# Patient Record
Sex: Male | Born: 1960
Health system: Southern US, Community
[De-identification: ages and names within clinical notes are randomized; demographics above are authoritative.]

---

## 2005-12-09 ENCOUNTER — Ambulatory Visit: Payer: Self-pay

## 2017-11-17 ENCOUNTER — Ambulatory Visit: Payer: Managed Care, Other (non HMO) | Admitting: Urology

## 2017-11-17 ENCOUNTER — Encounter: Payer: Self-pay | Admitting: Urology

## 2017-11-17 VITALS — BP 128/74 | HR 94 | Ht 66.0 in | Wt 179.6 lb

## 2017-11-17 DIAGNOSIS — R972 Elevated prostate specific antigen [PSA]: Secondary | ICD-10-CM

## 2017-11-17 DIAGNOSIS — R310 Gross hematuria: Secondary | ICD-10-CM | POA: Diagnosis not present

## 2017-11-17 LAB — URINALYSIS, COMPLETE
Bilirubin, UA: NEGATIVE
Glucose, UA: NEGATIVE
Leukocytes, UA: NEGATIVE
Nitrite, UA: NEGATIVE
RBC, UA: NEGATIVE
Specific Gravity, UA: 1.025 (ref 1.005–1.030)
Urobilinogen, Ur: 1 mg/dL (ref 0.2–1.0)
pH, UA: 5.5 (ref 5.0–7.5)

## 2017-11-17 LAB — MICROSCOPIC EXAMINATION
Bacteria, UA: NONE SEEN
Epithelial Cells (non renal): NONE SEEN /hpf (ref 0–10)
RBC, UA: NONE SEEN /hpf (ref 0–?)
WBC, UA: NONE SEEN /hpf (ref 0–?)

## 2017-11-17 NOTE — Progress Notes (Signed)
11/17/2017 2:06 PM   Larry Moses 1961/04/27 [id number]  Referring provider: Kellie Shropshire, NP [address] [place], [place] [address]  Chief Complaint  Patient presents with  . Hematuria    Pt states that this happened for the first time last Wednesday     HPI: Larry Moses is a 57 year old male who on 11/08/2017 had onset of total gross painless hematuria.  He describes his urine as dark red without clots.  He had no voiding symptoms and denied flank, abdominal, pelvic or scrotal pain.  He states the next morning his urine was burgundy in appearance and by Friday his hematuria had resolved.  Denies previous history of hematuria or other urologic problems or prior urologic evaluation.  He did see his primary provider 3 years ago and his PSA was 4.1.  It was recommended that it be repeated however he never had this done. He does have a significant tobacco history and has been smoking 1 pack/day for the last 41 years. He was seen at an urgent care facility on 2/7 and urinalysis did show large blood on dipstick.   PMH: History reviewed. No pertinent past medical history.  Surgical History: History reviewed. No pertinent surgical history.  Home Medications:  Allergies as of 11/17/2017   No Known Allergies     Medication List    as of 11/17/2017  2:06 PM   You have not been prescribed any medications.     Allergies: No Known Allergies  Family History: Family History  Problem Relation Age of Onset  . Ovarian cancer Mother   . Breast cancer Mother   . Lung cancer Maternal Grandfather     Social History:  reports that he has been smoking cigarettes.  He has a 25.00 pack-year smoking history. His smokeless tobacco use includes snuff. He reports that he does not drink alcohol or use drugs.  ROS: UROLOGY Frequent Urination?: No Hard to postpone urination?: Yes Burning/pain with urination?: No Get up at night to urinate?: Yes Leakage of urine?: No Urine  stream starts and stops?: No Trouble starting stream?: No Do you have to strain to urinate?: No Blood in urine?: Yes Urinary tract infection?: No Sexually transmitted disease?: No Injury to kidneys or bladder?: No Painful intercourse?: No Weak stream?: No Erection problems?: No Penile pain?: No  Gastrointestinal Nausea?: No Vomiting?: No Indigestion/heartburn?: No Diarrhea?: No Constipation?: No  Constitutional Fever: No Night sweats?: No Weight loss?: No Fatigue?: No  Skin Skin rash/lesions?: No Itching?: No  Eyes Blurred vision?: No Double vision?: No  Ears/Nose/Throat Sore throat?: No Sinus problems?: No  Hematologic/Lymphatic Swollen glands?: No Easy bruising?: No  Cardiovascular Leg swelling?: No Chest pain?: No  Respiratory Cough?: No Shortness of breath?: No  Endocrine Excessive thirst?: No  Musculoskeletal Back pain?: No Joint pain?: No  Neurological Headaches?: No Dizziness?: No  Psychologic Depression?: No Anxiety?: No  Physical Exam: BP 128/74 (BP Location: Left Arm, Patient Position: Sitting, Cuff Size: Normal)   Pulse 94   Ht 5\' 6"  (1.676 m)   Wt 179 lb 9.6 oz (81.5 kg)   SpO2 97%   BMI 28.99 kg/m   Constitutional:  Alert and oriented, No acute distress. HEENT: [place] AT, moist mucus membranes.  Trachea midline, no masses. Cardiovascular: No clubbing, cyanosis, or edema. Respiratory: Normal respiratory effort, no increased work of breathing. GI: Abdomen is soft, nontender, nondistended, no abdominal masses GU: No CVA tenderness.  Penis without lesions, testes descended bilaterally without masses or tenderness cord/epididymes  palpably normal.  Prostate 40 g, smooth without nodules. Skin: No rashes, bruises or suspicious lesions. Lymph: No cervical or inguinal adenopathy. Neurologic: Grossly intact, no focal deficits, moving all 4 extremities. Psychiatric: Normal mood and affect.  Laboratory Data:  Urinalysis Dipstick 1+  ketone 1+ protein negative blood or leukocytes.  Microscopy negative   Assessment & Plan:    1. Gross hematuria We discussed potential causes of gross hematuria including both benign and malignant.  I recommended a complete hematuria evaluation to include CT urogram and cystoscopy.  - Urinalysis, Complete - CT HEMATURIA WORKUP; Future  2. Elevated PSA Benign DRE.  PSA was drawn today.  - PSA   No Follow-up on file.  Abbie Sons, [place] [address], [place], [place] [address] [phone]

## 2017-11-18 LAB — PSA: Prostate Specific Ag, Serum: 2.1 ng/mL (ref 0.0–4.0)

## 2017-11-21 ENCOUNTER — Telehealth: Payer: Self-pay | Admitting: Family Medicine

## 2017-11-21 ENCOUNTER — Encounter: Payer: Self-pay | Admitting: Family Medicine

## 2017-11-21 NOTE — Telephone Encounter (Signed)
-----   Message from Abbie Sons, MD sent at 11/20/2017  4:44 PM EST ----- Repeat psa was nml at 2.1

## 2017-11-21 NOTE — Telephone Encounter (Signed)
Letter sent.

## 2017-12-29 ENCOUNTER — Ambulatory Visit
Admission: RE | Admit: 2017-12-29 | Discharge: 2017-12-29 | Disposition: A | Discharge status: Home / Self Care | Payer: Managed Care, Other (non HMO) | Source: Ambulatory Visit | Attending: Urology | Admitting: Urology

## 2017-12-29 DIAGNOSIS — N400 Enlarged prostate without lower urinary tract symptoms: Secondary | ICD-10-CM | POA: Diagnosis not present

## 2017-12-29 DIAGNOSIS — R310 Gross hematuria: Secondary | ICD-10-CM | POA: Insufficient documentation

## 2017-12-29 DIAGNOSIS — I700 Atherosclerosis of aorta: Secondary | ICD-10-CM | POA: Diagnosis not present

## 2017-12-29 DIAGNOSIS — N289 Disorder of kidney and ureter, unspecified: Secondary | ICD-10-CM | POA: Diagnosis not present

## 2017-12-29 DIAGNOSIS — R911 Solitary pulmonary nodule: Secondary | ICD-10-CM | POA: Diagnosis not present

## 2017-12-29 MED ORDER — IOPAMIDOL (ISOVUE-300) INJECTION 61%
125.0000 mL | Freq: Once | INTRAVENOUS | Status: AC | PRN
  Administered 2017-12-29: 125 mL via INTRAVENOUS

## 2018-01-02 ENCOUNTER — Other Ambulatory Visit: Payer: Managed Care, Other (non HMO) | Admitting: Urology

## 2018-01-26 ENCOUNTER — Encounter: Payer: Self-pay | Admitting: Urology

## 2018-01-26 ENCOUNTER — Ambulatory Visit: Payer: Managed Care, Other (non HMO) | Admitting: Urology

## 2018-01-26 VITALS — BP 143/85 | HR 84 | Ht 66.0 in | Wt 175.0 lb

## 2018-01-26 DIAGNOSIS — N2889 Other specified disorders of kidney and ureter: Secondary | ICD-10-CM

## 2018-01-26 DIAGNOSIS — R310 Gross hematuria: Secondary | ICD-10-CM | POA: Diagnosis not present

## 2018-01-26 LAB — MICROSCOPIC EXAMINATION: Bacteria, UA: NONE SEEN

## 2018-01-26 LAB — URINALYSIS, COMPLETE
Bilirubin, UA: NEGATIVE
Glucose, UA: NEGATIVE
Leukocytes, UA: NEGATIVE
Nitrite, UA: NEGATIVE
Protein, UA: NEGATIVE
RBC, UA: NEGATIVE
Specific Gravity, UA: 1.02 (ref 1.005–1.030)
Urobilinogen, Ur: 0.2 mg/dL (ref 0.2–1.0)
pH, UA: 5 (ref 5.0–7.5)

## 2018-01-26 MED ORDER — LIDOCAINE HCL URETHRAL/MUCOSAL 2 % EX GEL: 1.0000 "application " | Freq: Once | CUTANEOUS | Status: DC

## 2018-01-26 MED ORDER — CIPROFLOXACIN HCL 500 MG PO TABS: 500.0000 mg | ORAL_TABLET | Freq: Once | ORAL | Status: DC

## 2018-01-26 NOTE — Progress Notes (Signed)
01/26/2018 1:57 PM   Larry Moses [birth year] [id number]  Referring provider: No referring provider defined for this encounter.  Chief Complaint  Patient presents with  . Cysto    HPI: 57 year old male seen on 11/17/2017 after an episode of total gross painless hematuria.  He has a significant tobacco history.  He had his CT urogram on 3/29 and was scheduled for cystoscopy today however he refused to have cystoscopy done stating he has not had any recurrent bleeding and he just wanted his CT results.  His CT showed a 17 mm right upper pole mass that was felt to most likely be a cyst however there was slight enhancement noted most likely due to volume averaging.  Radiology did recommend a renal mass protocol MRI.   PMH: No past medical history on file.  Surgical History: No past surgical history on file.  Home Medications:  Allergies as of 01/26/2018   No Known Allergies     Medication List    as of 01/26/2018  1:57 PM   You have not been prescribed any medications.     Allergies: No Known Allergies  Family History: Family History  Problem Relation Age of Onset  . Ovarian cancer Mother   . Breast cancer Mother   . Lung cancer Maternal Grandfather     Social History:  reports that he has been smoking cigarettes.  He has a 25.00 pack-year smoking history. His smokeless tobacco use includes snuff. He reports that he does not drink alcohol or use drugs.  ROS: No significant changes from 11/17/2017  Physical Exam: BP (!) 143/85 (BP Location: Left Arm, Patient Position: Sitting, Cuff Size: Normal)   Pulse 84   Ht 5\' 6"  (1.676 m)   Wt 175 lb (79.4 kg)   BMI 28.25 kg/m   Constitutional:  Alert and oriented, No acute distress.   Pertinent Imaging: CT images personally reviewed  Results for orders placed during the hospital encounter of 12/29/17  CT HEMATURIA WORKUP   Narrative CLINICAL DATA:  Painless gross hematuria 6 weeks ago.  EXAM: CT ABDOMEN AND  PELVIS WITHOUT AND WITH CONTRAST  TECHNIQUE: Multidetector CT imaging of the abdomen and pelvis was performed following the standard protocol before and following the bolus administration of intravenous contrast.  CONTRAST:  140mL ISOVUE-300 IOPAMIDOL (ISOVUE-300) INJECTION 61%  COMPARISON:  None.  FINDINGS: Lower chest: 2 mm left lower lobe pulmonary nodule on image 1/24. Normal heart size without pericardial or pleural effusion.  Hepatobiliary: Mild nonspecific caudate lobe prominence. No focal liver lesion. Normal gallbladder, without biliary ductal dilatation.  Pancreas: Normal, without mass or ductal dilatation.  Spleen: Normal in size, without focal abnormality.  Adrenals/Urinary Tract: Normal adrenal glands. No renal calculi or hydronephrosis. No hydroureter or ureteric calculi. No bladder calculi.  Exophytic upper pole right renal lesion measures 1.7 cm on image 30/9. Density measurements best evaluated on coronal reformats. Approximately 18 HU prior to contrast and maximally 31 HU after contrast.  bilateral too small to characterize lesions. Moderate renal collecting system opacification on delayed images. Good ureteric opacification, without filling defect. The median lobe of the prostate impresses into the urinary bladder. No enhancing bladder mass or other bladder filling defect on delayed images.  Stomach/Bowel: Normal stomach, without wall thickening. Scattered colonic diverticula. Wall thickening involving the sigmoid is likely related to muscular hypertrophy. Normal terminal ileum and appendix. Normal small bowel.  Vascular/Lymphatic: Aortic and branch vessel atherosclerosis. No abdominopelvic adenopathy. Increased density in the jejunal mesenteric fat with  small nodes within. Example image 32/9.  Reproductive: Mild prostatomegaly.  Other: No significant free fluid. Tiny periumbilical fat containing hernia.  Musculoskeletal: Mild convex left lumbar  spine curvature. Lumbosacral spondylosis.  IMPRESSION: 1. Right renal lesion is favored to represent a minimally complex cyst. Apparent density increase after contrast is most likely related to "pseudo enhancement" a CT reconstruction artifact. True enhancement of a solid renal neoplasm is less likely. Consider further evaluation with dedicated pre and post contrast abdominal MRI. 2. Otherwise, no explanation for hematuria. 3.  Aortic Atherosclerosis (ICD10-I70.0). 4. Prostatomegaly with impression of the median lobe into the urinary bladder. 5. Jejunal mesenteric findings which may represent mild mesenteric panniculitis. This is of indeterminate clinical significance, as appearance can be seen in asymptomatic individuals. 6. Tiny left lung base nodule. No follow-up needed if patient is low-risk. Non-contrast chest CT can be considered in 12 months if patient is high-risk. This recommendation follows the consensus statement: Guidelines for Management of Incidental Pulmonary Nodules Detected on CT Images: From the Fleischner Society 2017; Radiology 2017; 284:228-243.   Electronically Signed   By: Abigail Miyamoto M.D.   On: 12/29/2017 14:34     Assessment & Plan:   His right renal lesion is most likely a cyst.  Will schedule a renal mass protocol MRI with follow-up in approximately 3 months.  I discussed the standard evaluation for hematuria his upper tract imaging for the kidney/ureters and cystoscopy for lower tract evaluation.  He was informed that without cystoscopy the evaluation is considered incomplete and that a bladder or lower tract malignancy may be missed.  He was also informed that bladder cancers can bleed intermittently and the fact that he has not had recurrent hematuria and does not rule out the possibility of malignancy.  He indicated if he had recurrent hematuria he would reconsider the cystoscopy.    Abbie Sons, [place] [address], [place], [place] [address] [phone]

## 2018-04-26 ENCOUNTER — Ambulatory Visit
Admission: RE | Admit: 2018-04-26 | Discharge: 2018-04-26 | Disposition: A | Discharge status: Home / Self Care | Payer: Managed Care, Other (non HMO) | Source: Ambulatory Visit | Attending: Urology | Admitting: Urology

## 2018-04-26 DIAGNOSIS — N2889 Other specified disorders of kidney and ureter: Secondary | ICD-10-CM | POA: Insufficient documentation

## 2018-04-26 DIAGNOSIS — N281 Cyst of kidney, acquired: Secondary | ICD-10-CM | POA: Diagnosis not present

## 2018-04-26 MED ORDER — GADOBENATE DIMEGLUMINE 529 MG/ML IV SOLN
15.0000 mL | Freq: Once | INTRAVENOUS | Status: AC | PRN
  Administered 2018-04-26: 15 mL via INTRAVENOUS

## 2018-04-27 ENCOUNTER — Ambulatory Visit: Payer: Managed Care, Other (non HMO) | Admitting: Urology

## 2018-04-27 ENCOUNTER — Telehealth: Payer: Self-pay | Admitting: Urology

## 2018-04-27 NOTE — Telephone Encounter (Signed)
Pt had to reschedule appt this afternoon and first available was 9/11.  He wanted to know if you could call him with MRI results.

## 2018-04-29 NOTE — Telephone Encounter (Signed)
Renal MRI shows a benign hemorrhagic/proteinaceous cyst.  Follow-up as scheduled.

## 2018-05-01 NOTE — Telephone Encounter (Signed)
Pt informed

## 2018-06-12 ENCOUNTER — Telehealth: Payer: Self-pay | Admitting: Urology

## 2018-06-12 NOTE — Telephone Encounter (Signed)
Pt called to cancel appt on Wed. 9/11.  He said he's already been given results over the phone and he would call back if he needed [place].  Just F.Y.I.

## 2018-06-13 ENCOUNTER — Ambulatory Visit: Payer: Managed Care, Other (non HMO) | Admitting: Urology

## 2018-12-20 ENCOUNTER — Ambulatory Visit: Payer: Managed Care, Other (non HMO) | Admitting: Urology

## 2018-12-20 ENCOUNTER — Encounter: Payer: Self-pay | Admitting: Urology

## 2018-12-20 ENCOUNTER — Other Ambulatory Visit: Payer: Self-pay

## 2018-12-20 VITALS — BP 154/82 | HR 81 | Ht 66.0 in | Wt 179.0 lb

## 2018-12-20 DIAGNOSIS — R310 Gross hematuria: Secondary | ICD-10-CM

## 2018-12-20 NOTE — Progress Notes (Signed)
12/20/2018  2:34 PM   Larry Moses 25-Dec-1960 [id number]  Referring provider: No referring provider defined for this encounter.  Chief Complaint  Patient presents with  . Hematuria    HPI: Larry Moses is a 58 yo M who presents today for the evaluation and management of gross hematuria.   -Gross hematuria throughout yesterday and resolved prior to visit today   -No pain and discomfort with gross hematuria   - Refused to have cystoscopy done in the past since he did not have any recurrent bleeding at the time and only elected for CT results  -CT from 12/29/2017 showed a 17 mm right upper pole mass that was felt to most likely be a cyst however slight enhancement noted most likely due to volume averaging.  -Renal MRI form 04/26/2018  recommended by radiology showed a benign hemorrhagic/proteinaceous cyst.  -Gross hematuria throughout yesterday and resolved prior to visit today   -No pain and discomfort with gross hematuria   PMH: No past medical history on file.  Surgical History: No past surgical history on file.  Home Medications:  Allergies as of 12/20/2018   No Known Allergies     Medication List    as of December 20, 2018  2:34 PM   You have not been prescribed any medications.     Allergies: No Known Allergies  Family History: Family History  Problem Relation Age of Onset  . Ovarian cancer Mother   . Breast cancer Mother   . Lung cancer Maternal Grandfather     Social History:  reports that he has been smoking cigarettes. He has a 25.00 pack-year smoking history. His smokeless tobacco use includes snuff. He reports that he does not drink alcohol or use drugs.  ROS: UROLOGY Frequent Urination?: No Hard to postpone urination?: No Burning/pain with urination?: No Get up at night to urinate?: No Leakage of urine?: No Urine stream starts and stops?: No Trouble starting stream?: No Do you have to strain to urinate?: No Blood in urine?: Yes Urinary  tract infection?: No Sexually transmitted disease?: No Injury to kidneys or bladder?: No Painful intercourse?: No Weak stream?: No Erection problems?: No Penile pain?: No  Gastrointestinal Nausea?: No Vomiting?: No Indigestion/heartburn?: No Diarrhea?: No Constipation?: No  Constitutional Fever: No Night sweats?: No Weight loss?: No Fatigue?: No  Skin Skin rash/lesions?: No Itching?: No  Eyes Blurred vision?: No Double vision?: No  Ears/Nose/Throat Sore throat?: No Sinus problems?: No  Hematologic/Lymphatic Swollen glands?: No Easy bruising?: No  Cardiovascular Leg swelling?: No Chest pain?: No  Respiratory Cough?: No Shortness of breath?: No  Endocrine Excessive thirst?: No  Musculoskeletal Back pain?: No Joint pain?: No  Neurological Headaches?: No Dizziness?: No  Psychologic Depression?: No Anxiety?: No  Physical Exam: BP (!) 154/82 (BP Location: Left Arm, Patient Position: Sitting, Cuff Size: Normal)   Pulse 81   Ht 5\' 6"  (1.676 m)   Wt 179 lb (81.2 kg)   BMI 28.89 kg/m   Constitutional:  Alert and oriented, No acute distress. HEENT: [place] AT, moist mucus membranes.  Trachea midline, no masses. Cardiovascular: No clubbing, cyanosis, or edema. Respiratory: Normal respiratory effort, no increased work of breathing. Skin: No rashes, bruises or suspicious lesions. Neurologic: Grossly intact, no focal deficits, moving all 4 extremities. Psychiatric: Normal mood and affect.  Pertinent Imaging: Results for orders placed during the hospital encounter of 12/29/17  CT HEMATURIA WORKUP   Narrative CLINICAL DATA:  Painless gross hematuria 6 weeks ago.  EXAM:  CT ABDOMEN AND PELVIS WITHOUT AND WITH CONTRAST  TECHNIQUE: Multidetector CT imaging of the abdomen and pelvis was performed following the standard protocol before and following the bolus administration of intravenous contrast.  CONTRAST:  180mL ISOVUE-300 IOPAMIDOL (ISOVUE-300)  INJECTION 61%  COMPARISON:  None.  FINDINGS: Lower chest: 2 mm left lower lobe pulmonary nodule on image 1/24. Normal heart size without pericardial or pleural effusion.  Hepatobiliary: Mild nonspecific caudate lobe prominence. No focal liver lesion. Normal gallbladder, without biliary ductal dilatation.  Pancreas: Normal, without mass or ductal dilatation.  Spleen: Normal in size, without focal abnormality.  Adrenals/Urinary Tract: Normal adrenal glands. No renal calculi or hydronephrosis. No hydroureter or ureteric calculi. No bladder calculi.  Exophytic upper pole right renal lesion measures 1.7 cm on image 30/9. Density measurements best evaluated on coronal reformats. Approximately 18 HU prior to contrast and maximally 31 HU after contrast.  bilateral too small to characterize lesions. Moderate renal collecting system opacification on delayed images. Good ureteric opacification, without filling defect. The median lobe of the prostate impresses into the urinary bladder. No enhancing bladder mass or other bladder filling defect on delayed images.  Stomach/Bowel: Normal stomach, without wall thickening. Scattered colonic diverticula. Wall thickening involving the sigmoid is likely related to muscular hypertrophy. Normal terminal ileum and appendix. Normal small bowel.  Vascular/Lymphatic: Aortic and branch vessel atherosclerosis. No abdominopelvic adenopathy. Increased density in the jejunal mesenteric fat with small nodes within. Example image 32/9.  Reproductive: Mild prostatomegaly.  Other: No significant free fluid. Tiny periumbilical fat containing hernia.  Musculoskeletal: Mild convex left lumbar spine curvature. Lumbosacral spondylosis.  IMPRESSION: 1. Right renal lesion is favored to represent a minimally complex cyst. Apparent density increase after contrast is most likely related to "pseudo enhancement" a CT reconstruction artifact. True enhancement of a  solid renal neoplasm is less likely. Consider further evaluation with dedicated pre and post contrast abdominal MRI. 2. Otherwise, no explanation for hematuria. 3.  Aortic Atherosclerosis (ICD10-I70.0). 4. Prostatomegaly with impression of the median lobe into the urinary bladder. 5. Jejunal mesenteric findings which may represent mild mesenteric panniculitis. This is of indeterminate clinical significance, as appearance can be seen in asymptomatic individuals. 6. Tiny left lung base nodule. No follow-up needed if patient is low-risk. Non-contrast chest CT can be considered in 12 months if patient is high-risk. This recommendation follows the consensus statement: Guidelines for Management of Incidental Pulmonary Nodules Detected on CT Images: From the Fleischner Society 2017; Radiology 2017; 284:228-243.   Electronically Signed   By: Abigail Miyamoto M.D.   On: 12/29/2017 14:34    CLINICAL DATA:  Hematuria. Indeterminate right renal lesion on recent CT.  EXAM: MRI ABDOMEN WITHOUT AND WITH CONTRAST  TECHNIQUE: Multiplanar multisequence MR imaging of the abdomen was performed both before and after the administration of intravenous contrast.  CONTRAST:  46mL MULTIHANCE GADOBENATE DIMEGLUMINE 529 MG/ML IV SOLN  COMPARISON:  CT on 12/29/2017  FINDINGS: Lower chest: No acute findings.  Hepatobiliary: No hepatic masses identified. Gallbladder is unremarkable. No evidence of biliary ductal dilatation.  Pancreas:  No mass or inflammatory changes.  Spleen:  Within normal limits in size and appearance.  Adrenals/Urinary Tract: A 1.8 cm subcapsular cyst is seen in the posterior upper pole right kidney which shows minimal layering T1 hyperintensity, consistent with a Bosniak category 2 hemorrhagic cyst. No renal masses identified. No evidence of hydronephrosis.  Stomach/Bowel: Visualized portion unremarkable.  Vascular/Lymphatic: No pathologically enlarged lymph nodes  identified. No abdominal aortic aneurysm.  Other:  None.  Musculoskeletal:  No suspicious bone lesions identified.  IMPRESSION: 1.8 cm benign Bosniak category 2 right renal cyst.  No evidence of renal neoplasm, hydronephrosis, or other significant abnormality.   Electronically Signed   By: Earle Gell M.D.   On: 04/26/2018 13:02  Assessment & Plan:    1. Gross hematuria   Refused cystoscopy in past  CT and MRI indicated benign cyst  Pt willing to have cystoscopy done and will return for next available cysto   Return for cysto .  Abbie Sons, [place] [address], [place], [place] [address] [phone]  I, Lucas Mallow, am acting as a scribe for Dr. Nicki Reaper C. Stoioff,  I, Abbie Sons, MD, have reviewed all documentation for this visit. The documentation on 12/20/18 for the exam, diagnosis, procedures, and orders are all accurate and complete.

## 2018-12-20 NOTE — Patient Instructions (Signed)
Hematuria, Adult  Hematuria is blood in the urine. Blood may be visible in the urine, or it may be identified with a test. This condition can be caused by infections of the bladder, urethra, kidney, or prostate. Other possible causes include:   Kidney stones.   Cancer of the urinary tract.   Too much calcium in the urine.   Conditions that are passed from parent to child (inherited conditions).   Exercise that requires a lot of energy.  Infections can usually be treated with medicine, and a kidney stone usually will pass through your urine. If neither of these is the cause of your hematuria, more tests may be needed to identify the cause of your symptoms.  It is very important to tell your health care provider about any blood in your urine, even if it is painless or the blood stops without treatment. Blood in the urine, when it happens and then stops and then happens again, can be a symptom of a very serious condition, including cancer. There is no pain in the initial stages of many urinary cancers.  Follow these instructions at home:  Medicines   Take over-the-counter and prescription medicines only as told by your health care provider.   If you were prescribed an antibiotic medicine, take it as told by your health care provider. Do not stop taking the antibiotic even if you start to feel better.  Eating and drinking   Drink enough fluid to keep your urine clear or pale yellow. It is recommended that you drink 3-4 quarts (2.8-3.8 L) a day. If you have been diagnosed with an infection, it is recommended that you drink cranberry juice in addition to large amounts of water.   Avoid caffeine, tea, and carbonated beverages. These tend to irritate the bladder.   Avoid alcohol because it may irritate the prostate (men).  General instructions   If you have been diagnosed with a kidney stone, follow your health care provider's instructions about straining your urine to catch the stone.   Empty your bladder  often. Avoid holding urine for long periods of time.   If you are male:  ? After a bowel movement, wipe from front to back and use each piece of toilet paper only once.  ? Empty your bladder before and after sex.   Pay attention to any changes in your symptoms. Tell your health care provider about any changes or any new symptoms.   It is your responsibility to get your test results. Ask your health care provider, or the department performing the test, when your results will be ready.   Keep all follow-up visits as told by your health care provider. This is important.  Contact a health care provider if:   You develop back pain.   You have a fever.   You have nausea or vomiting.   Your symptoms do not improve after 3 days.   Your symptoms get worse.  Get help right away if:   You develop severe vomiting and are unable take medicine without vomiting.   You develop severe pain in your back or abdomen even though you are taking medicine.   You pass a large amount of blood in your urine.   You pass blood clots in your urine.   You feel very weak or like you might faint.   You faint.  Summary   Hematuria is blood in the urine. It has many possible causes.   It is very important that you tell   your health care provider about any blood in your urine, even if it is painless or the blood stops without treatment.   Take over-the-counter and prescription medicines only as told by your health care provider.   Drink enough fluid to keep your urine clear or pale yellow.  This information is not intended to replace advice given to you by your health care provider. Make sure you discuss any questions you have with your health care provider.  Document Released: 09/19/2005 Document Revised: 10/22/2016 Document Reviewed: 10/22/2016  Elsevier Interactive Patient Education  2019 [employer]

## 2019-01-04 ENCOUNTER — Other Ambulatory Visit: Payer: Self-pay | Admitting: Urology

## 2019-01-08 ENCOUNTER — Other Ambulatory Visit: Payer: Self-pay

## 2019-01-08 ENCOUNTER — Ambulatory Visit: Payer: Managed Care, Other (non HMO) | Admitting: Urology

## 2019-01-08 ENCOUNTER — Encounter: Payer: Self-pay | Admitting: Urology

## 2019-01-08 VITALS — BP 138/87 | HR 82 | Ht 66.0 in | Wt 179.0 lb

## 2019-01-08 DIAGNOSIS — R310 Gross hematuria: Secondary | ICD-10-CM

## 2019-01-08 LAB — URINALYSIS, COMPLETE
Bilirubin, UA: NEGATIVE
Glucose, UA: NEGATIVE
Ketones, UA: NEGATIVE
Leukocytes,UA: NEGATIVE
Nitrite, UA: NEGATIVE
Protein,UA: NEGATIVE
RBC, UA: NEGATIVE
Specific Gravity, UA: 1.025 (ref 1.005–1.030)
Urobilinogen, Ur: 0.2 mg/dL (ref 0.2–1.0)
pH, UA: 5 (ref 5.0–7.5)

## 2019-01-08 LAB — MICROSCOPIC EXAMINATION
Bacteria, UA: NONE SEEN
Epithelial Cells (non renal): NONE SEEN /hpf (ref 0–10)
RBC: NONE SEEN /hpf (ref 0–2)
WBC, UA: NONE SEEN /hpf (ref 0–5)

## 2019-01-08 MED ORDER — LIDOCAINE HCL URETHRAL/MUCOSAL 2 % EX GEL: 1.0000 "application " | Freq: Once | CUTANEOUS | Status: DC

## 2019-01-08 NOTE — Progress Notes (Signed)
   01/08/19  CC:  Chief Complaint  Patient presents with  . Cysto    HPI: 58 year old male with recurrent gross hematuria.  Initially evaluated March 2019.  CTU showed a 17 mm exophytic upper pole right renal lesion with questionable enhancement.  MRI July 2019 consistent with a Bosniak 2 renal cyst.  He declined cystoscopy last year however had a recent episode of recurrent gross hematuria and elected to proceed with lower tract evaluation.  His hematuria has not recurred.  Blood pressure 138/87, pulse 82, height 5\' 6"  (1.676 m), weight 179 lb (81.2 kg). NED. A&Ox3.   No respiratory distress   Abd soft, NT, ND Normal phallus with bilateral descended testicles  Cystoscopy Procedure Note  Patient identification was confirmed, informed consent was obtained, and patient was prepped using Betadine solution.  Lidocaine jelly was administered per urethral meatus.     Pre-Procedure: - Inspection reveals a normal caliber urethral meatus.  Procedure: The flexible cystoscope was introduced without difficulty - No urethral strictures/lesions are present. - Prominent lateral lobe enlargement with 4 cm prostatic urethra.  Prominent hypervascularity and friability prostate  - Elevated bladder neck - Bilateral ureteral orifices identified - Bladder mucosa  reveals no ulcers, tumors, or lesions - No bladder stones -Mild trabeculation  Retroflexion shows prominent hypervascularity of the bladder neck, no tumor   Post-Procedure: - Patient tolerated the procedure well  Assessment/ Plan: Gross hematuria most likely secondary to BPH.  Urine was sent for cytology and if negative recommend a one-year follow-up.  To call for recurrent hematuria.  Return in about 1 year (around 01/08/2020) for Recheck.   Abbie Sons, MD

## 2019-01-10 ENCOUNTER — Other Ambulatory Visit: Payer: Self-pay | Admitting: Urology

## 2019-01-10 NOTE — Progress Notes (Signed)
Urine cytology showed no cells suspicious for malignancy.    Informed patient as directed-per Dr. Dene Gentry note. Patient verbalized understanding.

## 2020-01-10 ENCOUNTER — Ambulatory Visit: Payer: Managed Care, Other (non HMO) | Admitting: Urology

## 2020-01-19 NOTE — Progress Notes (Incomplete)
   01/20/20 1:34 PM   Larry Moses [birth year] [id number]  Referring provider: Cletis Athens, MD [address] [place],  [place] [address]  No chief complaint on file.   HPI: Larry Moses is a 59 y.o. M who returns today for 1 year f/u for symptom recheck.   -hx of recurrent gross hematuria  -CTU  12/29/2017, 17 mm exophytic upper pole right renal lesion with questionable enlargement  -MRI July 2019 consistent with Bosniak 2 renal cyst    1. ***  *** 2. *** *** 3. *** ***       PMH: No past medical history on file.  Surgical History: No past surgical history on file.  Home Medications:  Allergies as of 01/20/2020   No Known Allergies     Medication List    as of January 19, 2020  1:34 PM   You have not been prescribed any medications.     Allergies: No Known Allergies  Family History: Family History  Problem Relation Age of Onset  . Ovarian cancer Mother   . Breast cancer Mother   . Lung cancer Maternal Grandfather     Social History:  reports that he has been smoking cigarettes. He has a 25.00 pack-year smoking history. His smokeless tobacco use includes snuff. He reports that he does not drink alcohol or use drugs.   Physical Exam: There were no vitals taken for this visit.  Constitutional:  Alert and oriented, No acute distress. HEENT: [name] AT, moist mucus membranes.  Trachea midline, no masses. Cardiovascular: No clubbing, cyanosis, or edema. Respiratory: Normal respiratory effort, no increased work of breathing. GI: Abdomen is soft, nontender, nondistended, no abdominal masses GU: No CVA tenderness Lymph: No cervical or inguinal lymphadenopathy. Skin: No rashes, bruises or suspicious lesions. Neurologic: Grossly intact, no focal deficits, moving all 4 extremities. Psychiatric: Normal mood and affect.  Laboratory Data:  Urinalysis  Pertinent Imaging: ***  Assessment & Plan:    @DIAGMED @  No follow-ups on file.  [facility] [address], [place], [place] [address] [phone]  I, Lucas Mallow, am acting as a scribe for Dr. Nicki Reaper C. Stoioff,  {Add Media planner

## 2020-01-20 ENCOUNTER — Ambulatory Visit: Payer: Self-pay | Admitting: Urology

## 2020-01-20 ENCOUNTER — Encounter: Payer: Self-pay | Admitting: Urology

## 2020-07-01 ENCOUNTER — Other Ambulatory Visit: Payer: Self-pay

## 2020-07-01 DIAGNOSIS — Z20822 Contact with and (suspected) exposure to covid-19: Secondary | ICD-10-CM

## 2020-07-01 DIAGNOSIS — M7021 Olecranon bursitis, right elbow: Secondary | ICD-10-CM | POA: Diagnosis not present

## 2020-07-02 LAB — SARS-COV-2, NAA 2 DAY TAT

## 2020-07-02 LAB — NOVEL CORONAVIRUS, NAA: SARS-CoV-2, NAA: NOT DETECTED

## 2020-07-28 ENCOUNTER — Ambulatory Visit (INDEPENDENT_AMBULATORY_CARE_PROVIDER_SITE_OTHER): Payer: BC Managed Care – PPO

## 2020-07-28 ENCOUNTER — Other Ambulatory Visit: Payer: Self-pay

## 2020-07-28 DIAGNOSIS — Z0000 Encounter for general adult medical examination without abnormal findings: Secondary | ICD-10-CM | POA: Diagnosis not present

## 2020-07-29 LAB — CBC WITH DIFFERENTIAL/PLATELET
Absolute Monocytes: 599 cells/uL (ref 200–950)
Basophils Absolute: 19 cells/uL (ref 0–200)
Basophils Relative: 0.3 %
Eosinophils Absolute: 50 cells/uL (ref 15–500)
Eosinophils Relative: 0.8 %
HCT: 47.9 % (ref 38.5–50.0)
Hemoglobin: 16.7 g/dL (ref 13.2–17.1)
Lymphs Abs: 2205 cells/uL (ref 850–3900)
MCH: 32.2 pg (ref 27.0–33.0)
MCHC: 34.9 g/dL (ref 32.0–36.0)
MCV: 92.5 fL (ref 80.0–100.0)
MPV: 10.3 fL (ref 7.5–12.5)
Monocytes Relative: 9.5 %
Neutro Abs: 3427 cells/uL (ref 1500–7800)
Neutrophils Relative %: 54.4 %
Platelets: 210 10*3/uL (ref 140–400)
RBC: 5.18 10*6/uL (ref 4.20–5.80)
RDW: 12.3 % (ref 11.0–15.0)
Total Lymphocyte: 35 %
WBC: 6.3 10*3/uL (ref 3.8–10.8)

## 2020-07-29 LAB — COMPLETE METABOLIC PANEL WITH GFR
AG Ratio: 1.7 (calc) (ref 1.0–2.5)
ALT: 26 U/L (ref 9–46)
AST: 25 U/L (ref 10–35)
Albumin: 4.3 g/dL (ref 3.6–5.1)
Alkaline phosphatase (APISO): 71 U/L (ref 35–144)
BUN: 11 mg/dL (ref 7–25)
CO2: 22 mmol/L (ref 20–32)
Calcium: 8.9 mg/dL (ref 8.6–10.3)
Chloride: 104 mmol/L (ref 98–110)
Creat: 0.71 mg/dL (ref 0.70–1.25)
GFR, Est African American: 118 mL/min/{1.73_m2} (ref >=60)
GFR, Est Non African American: 102 mL/min/{1.73_m2} (ref >=60)
Globulin: 2.5 g/dL (calc) (ref 1.9–3.7)
Glucose, Bld: 81 mg/dL (ref 65–99)
Potassium: 4.4 mmol/L (ref 3.5–5.3)
Sodium: 142 mmol/L (ref 135–146)
Total Bilirubin: 0.8 mg/dL (ref 0.2–1.2)
Total Protein: 6.8 g/dL (ref 6.1–8.1)

## 2020-07-29 LAB — HEMOGLOBIN A1C
Hgb A1c MFr Bld: 5.3 % of total Hgb (ref <5.7)
Mean Plasma Glucose: 105 (calc)
eAG (mmol/L): 5.8 (calc)

## 2020-07-29 LAB — TSH: TSH: 3.24 mIU/L (ref 0.40–4.50)

## 2020-07-29 LAB — PSA: PSA: 2.53 ng/mL (ref <=4.0)

## 2020-07-30 ENCOUNTER — Ambulatory Visit (INDEPENDENT_AMBULATORY_CARE_PROVIDER_SITE_OTHER): Payer: BC Managed Care – PPO | Admitting: Family Medicine

## 2020-07-30 ENCOUNTER — Encounter: Payer: Self-pay | Admitting: Family Medicine

## 2020-07-30 ENCOUNTER — Other Ambulatory Visit: Payer: Self-pay

## 2020-07-30 VITALS — BP 144/79 | HR 78 | Ht 67.0 in | Wt 183.9 lb

## 2020-07-30 DIAGNOSIS — Z0000 Encounter for general adult medical examination without abnormal findings: Secondary | ICD-10-CM | POA: Diagnosis not present

## 2020-07-30 DIAGNOSIS — T148XXA Other injury of unspecified body region, initial encounter: Secondary | ICD-10-CM | POA: Diagnosis not present

## 2020-07-30 DIAGNOSIS — Z23 Encounter for immunization: Secondary | ICD-10-CM | POA: Diagnosis not present

## 2020-07-30 DIAGNOSIS — Z720 Tobacco use: Secondary | ICD-10-CM | POA: Insufficient documentation

## 2020-07-30 NOTE — Assessment & Plan Note (Addendum)
No colon screening ever performed. Does not recall last TDAP will give today Has not had Low Dose CT this year.  He will call for eye exam.  PSA increased slightly from previous, will continue to monitor.

## 2020-07-30 NOTE — Assessment & Plan Note (Signed)
Recent abrasion healed, no sign of infection. TDAP ordered.

## 2020-07-30 NOTE — Assessment & Plan Note (Signed)
-   The patient currently smokes regularly. - change or avoid triggers like smoky places, drinking alcohol and other smokers

## 2020-07-30 NOTE — Progress Notes (Signed)
Established Patient Office Visit  SUBJECTIVE:  Subjective  Patient ID: Larry Moses, male    DOB: 08/28/1961  Age: 59 y.o. MRN: [id number]  CC:  Chief Complaint  Patient presents with  . Annual Exam    HPI Larry Moses is a 59 y.o. male presenting today for annual exam, no acute complaints. Smokes daily.   History reviewed. No pertinent past medical history.  History reviewed. No pertinent surgical history.  Family History  Problem Relation Age of Onset  . Ovarian cancer Mother   . Breast cancer Mother   . Lung cancer Maternal Grandfather     Social History   Socioeconomic History  . Marital status: Married    Spouse name: Not on file  . Number of children: Not on file  . Years of education: Not on file  . Highest education level: Not on file  Occupational History  . Not on file  Tobacco Use  . Smoking status: Current Every Day Smoker    Packs/day: 1.00    Years: 25.00    Pack years: 25.00    Types: Cigarettes  . Smokeless tobacco: Current User    Types: Snuff  Vaping Use  . Vaping Use: Never used  Substance and Sexual Activity  . Alcohol use: No  . Drug use: No  . Sexual activity: Yes  Other Topics Concern  . Not on file  Social History Narrative  . Not on file   Social Determinants of Health   Financial Resource Strain:   . Difficulty of Paying Living Expenses: Not on file  Food Insecurity:   . Worried About Charity fundraiser in the Last Year: Not on file  . Ran Out of Food in the Last Year: Not on file  Transportation Needs:   . Lack of Transportation (Medical): Not on file  . Lack of Transportation (Non-Medical): Not on file  Physical Activity:   . Days of Exercise per Week: Not on file  . Minutes of Exercise per Session: Not on file  Stress:   . Feeling of Stress : Not on file  Social Connections:   . Frequency of Communication with Friends and Family: Not on file  . Frequency of Social Gatherings with Friends and Family: Not on  file  . Attends Religious Services: Not on file  . Active Member of Clubs or Organizations: Not on file  . Attends Archivist Meetings: Not on file  . Marital Status: Not on file  Intimate Partner Violence:   . Fear of Current or Ex-Partner: Not on file  . Emotionally Abused: Not on file  . Physically Abused: Not on file  . Sexually Abused: Not on file    No current outpatient medications on file.   No Known Allergies  ROS Review of Systems  Constitutional: Negative.   HENT: Negative.   Eyes: Negative.   Respiratory: Negative.   Cardiovascular: Negative.   Gastrointestinal: Negative.   Endocrine: Negative.   Genitourinary: Negative.   Musculoskeletal: Negative.   Neurological: Negative.   Hematological: Negative.   Psychiatric/Behavioral: Negative.   All other systems reviewed and are negative.    OBJECTIVE:    Physical Exam Constitutional:      Appearance: Normal appearance.  HENT:     Right Ear: Tympanic membrane normal.     Nose: Nose normal.  Cardiovascular:     Rate and Rhythm: Tachycardia present.  Abdominal:     General: Abdomen is flat.  Palpations: Abdomen is soft.  Musculoskeletal:        General: Normal range of motion.     Cervical back: Normal range of motion and neck supple.  Skin:    General: Skin is warm.  Neurological:     General: No focal deficit present.     Mental Status: He is alert.  Psychiatric:        Mood and Affect: Mood normal.     BP (!) 144/79   Pulse 78   Ht _0  (1.702 m)   Wt 183 lb 14.4 oz (83.4 kg)   BMI 28.80 kg/m  Wt Readings from Last 3 Encounters:  07/30/20 183 lb 14.4 oz (83.4 kg)  01/08/19 179 lb (81.2 kg)  12/20/18 179 lb (81.2 kg)    Health Maintenance Due  Topic Date Due  . Hepatitis C Screening  Never done  . COVID-19 Vaccine (1) Never done  . HIV Screening  Never done  . COLONOSCOPY  Never done  . INFLUENZA VACCINE  Never done    There are no preventive care reminders to display  for this patient.  CBC Latest Ref Rng & Units 07/28/2020  WBC 3.8 - 10.8 Thousand/uL 6.3  Hemoglobin 13.2 - 17.1 g/dL 16.7  Hematocrit 38 - 50 % 47.9  Platelets 140 - 400 Thousand/uL 210   CMP Latest Ref Rng & Units 07/28/2020  Glucose 65 - 99 mg/dL 81  BUN 7 - 25 mg/dL 11  Creatinine 0.70 - 1.25 mg/dL 0.71  Sodium 135 - 146 mmol/L 142  Potassium 3.5 - 5.3 mmol/L 4.4  Chloride 98 - 110 mmol/L 104  CO2 20 - 32 mmol/L 22  Calcium 8.6 - 10.3 mg/dL 8.9  Total Protein 6.1 - 8.1 g/dL 6.8  Total Bilirubin 0.2 - 1.2 mg/dL 0.8  AST 10 - 35 U/L 25  ALT 9 - 46 U/L 26    Lab Results  Component Value Date   TSH 3.24 07/28/2020   No results found for: ALBUMIN, ANIONGAP, EGFR, GFR No results found for: CHOL, HDL, LDLCALC, CHOLHDL No results found for: TRIG Lab Results  Component Value Date   HGBA1C 5.3 07/28/2020      ASSESSMENT & PLAN:   Problem List Items Addressed This Visit      Other   Tobacco abuse    - The patient currently smokes regularly. - change or avoid triggers like smoky places, drinking alcohol and other smokers        Relevant Orders   CT CHEST LUNG CA SCREEN LOW DOSE W/O CM   Annual physical exam - Primary    No colon screening ever performed. Does not recall last TDAP will give today Has not had Low Dose CT this year.  He will call for eye exam.  PSA increased slightly from previous, will continue to monitor.       Relevant Orders   Ambulatory referral to Gastroenterology   Tdap vaccine greater than or equal to 7yo IM (Completed)   Abrasion    Recent abrasion healed, no sign of infection. TDAP ordered.          No orders of the defined types were placed in this encounter.     Follow-up: No follow-ups on file.    Beckie Salts, [place] [address], [place], [place] [address]

## 2020-08-04 ENCOUNTER — Telehealth: Payer: Self-pay

## 2020-08-04 NOTE — Telephone Encounter (Signed)
Contacted patient for lung CT screening clinic based on referral from [facility].  I contacted patient on cell phone and home phone and unable to leave message at either number.

## 2020-08-05 ENCOUNTER — Telehealth: Payer: Self-pay

## 2020-08-05 NOTE — Telephone Encounter (Signed)
Patient contacted CT lung screening department and asked to be called after 5 to schedule CT scan.  I attempted to call patient at home and cell phone numbers with no answer at either number.

## 2020-08-07 NOTE — Telephone Encounter (Signed)
Patient returned call and left voicemail. Within 5 minutes attempt to call back at both # in EMR is unsuccessful and no voicemail option is available.

## 2020-08-12 ENCOUNTER — Other Ambulatory Visit: Payer: Self-pay

## 2020-08-12 ENCOUNTER — Telehealth (INDEPENDENT_AMBULATORY_CARE_PROVIDER_SITE_OTHER): Payer: Self-pay | Admitting: Gastroenterology

## 2020-08-12 DIAGNOSIS — Z1211 Encounter for screening for malignant neoplasm of colon: Secondary | ICD-10-CM

## 2020-08-12 MED ORDER — PEG 3350-KCL-NA BICARB-NACL 420 G PO SOLR: 4000.0000 mL | Freq: Once | ORAL | 0 refills | Status: AC

## 2020-08-12 NOTE — Progress Notes (Signed)
Gastroenterology Pre-Procedure Review  Request Date: Friday 09/11/20 Requesting Physician: Dr. Bonna Gains  PATIENT REVIEW QUESTIONS: The patient responded to the following health history questions as indicated:    1. Are you having any GI issues? no 2. Do you have a personal history of Polyps? no 3. Do you have a family history of Colon Cancer or Polyps? no 4. Diabetes Mellitus? no 5. Joint replacements in the past 12 months?no 6. Major health problems in the past 3 months?no 7. Any artificial heart valves, MVP, or defibrillator?no    MEDICATIONS & ALLERGIES:    Patient reports the following regarding taking any anticoagulation/antiplatelet therapy:   Plavix, Coumadin, Eliquis, Xarelto, Lovenox, Pradaxa, Brilinta, or Effient? no Aspirin? no  Patient confirms/reports the following medications:  Current Outpatient Medications  Medication Sig Dispense Refill  . diclofenac (VOLTAREN) 75 MG EC tablet diclofenac sodium 75 mg tablet,delayed release  TAKE 1 TABLET BY MOUTH TWICE A DAY    . polyethylene glycol-electrolytes (NULYTELY) 420 g solution Take 4,000 mLs by mouth once for 1 dose. 4000 mL 0   No current facility-administered medications for this visit.    Patient confirms/reports the following allergies:  No Known Allergies  Orders Placed This Encounter  Procedures  . Procedural/ Surgical Case Request: COLONOSCOPY WITH PROPOFOL    Standing Status:   Standing    Number of Occurrences:   1    Order Specific Question:   Pre-op diagnosis    Answer:   SCREENING COLONOSCOPY    Order Specific Question:   CPT Code    Answer:   98264    AUTHORIZATION INFORMATION Primary Insurance: 1D#: Group #:  Secondary Insurance: 1D#: Group #:  SCHEDULE INFORMATION: Date: Friday 09/11/20 Time: Location:[facility]

## 2020-08-18 ENCOUNTER — Encounter: Payer: Self-pay | Admitting: *Deleted

## 2020-08-21 ENCOUNTER — Encounter: Payer: Self-pay | Admitting: Family Medicine

## 2020-08-21 ENCOUNTER — Ambulatory Visit: Payer: BC Managed Care – PPO | Admitting: Family Medicine

## 2020-08-21 ENCOUNTER — Other Ambulatory Visit: Payer: Self-pay

## 2020-08-21 VITALS — BP 118/74 | HR 70 | Ht 66.0 in | Wt 185.1 lb

## 2020-08-21 DIAGNOSIS — R030 Elevated blood-pressure reading, without diagnosis of hypertension: Secondary | ICD-10-CM | POA: Diagnosis not present

## 2020-08-21 DIAGNOSIS — F17210 Nicotine dependence, cigarettes, uncomplicated: Secondary | ICD-10-CM | POA: Insufficient documentation

## 2020-08-21 NOTE — Progress Notes (Addendum)
Established Patient Office Visit  SUBJECTIVE:  Subjective  Patient ID: GERALDO HARIS, male    DOB: 07-Jun-1961  Age: 59 y.o. MRN: [id number]  CC:  Chief Complaint  Patient presents with  . Follow-up    HPI HUGHIE MELROY is a 60 y.o. male presenting today for     History reviewed. No pertinent past medical history.  History reviewed. No pertinent surgical history.  Family History  Problem Relation Age of Onset  . Ovarian cancer Mother   . Breast cancer Mother   . Lung cancer Maternal Grandfather     Social History   Socioeconomic History  . Marital status: Married    Spouse name: Not on file  . Number of children: Not on file  . Years of education: Not on file  . Highest education level: Not on file  Occupational History  . Not on file  Tobacco Use  . Smoking status: Current Every Day Smoker    Packs/day: 1.00    Years: 25.00    Pack years: 25.00    Types: Cigarettes  . Smokeless tobacco: Current User    Types: Snuff  Vaping Use  . Vaping Use: Never used  Substance and Sexual Activity  . Alcohol use: No  . Drug use: No  . Sexual activity: Yes  Other Topics Concern  . Not on file  Social History Narrative  . Not on file   Social Determinants of Health   Financial Resource Strain:   . Difficulty of Paying Living Expenses: Not on file  Food Insecurity:   . Worried About Charity fundraiser in the Last Year: Not on file  . Ran Out of Food in the Last Year: Not on file  Transportation Needs:   . Lack of Transportation (Medical): Not on file  . Lack of Transportation (Non-Medical): Not on file  Physical Activity:   . Days of Exercise per Week: Not on file  . Minutes of Exercise per Session: Not on file  Stress:   . Feeling of Stress : Not on file  Social Connections:   . Frequency of Communication with Friends and Family: Not on file  . Frequency of Social Gatherings with Friends and Family: Not on file  . Attends Religious Services: Not on  file  . Active Member of Clubs or Organizations: Not on file  . Attends Archivist Meetings: Not on file  . Marital Status: Not on file  Intimate Partner Violence:   . Fear of Current or Ex-Partner: Not on file  . Emotionally Abused: Not on file  . Physically Abused: Not on file  . Sexually Abused: Not on file     Current Outpatient Medications:  .  diclofenac (VOLTAREN) 75 MG EC tablet, diclofenac sodium 75 mg tablet,delayed release  TAKE 1 TABLET BY MOUTH TWICE A DAY, Disp: , Rfl:    No Known Allergies  ROS Review of Systems  Constitutional: Negative.   HENT: Negative.   Respiratory: Negative.   Cardiovascular: Negative.   Gastrointestinal: Negative.   Endocrine: Negative.   Musculoskeletal: Negative.   Neurological: Negative.   Hematological: Negative.   Psychiatric/Behavioral: Negative.   All other systems reviewed and are negative.    OBJECTIVE:    Physical Exam Vitals and nursing note reviewed.  Constitutional:      Appearance: Normal appearance.  HENT:     Head: Normocephalic.     Nose: Nose normal.     Mouth/Throat:     Mouth:  Mucous membranes are moist.  Eyes:     Pupils: Pupils are equal, round, and reactive to light.  Cardiovascular:     Rate and Rhythm: Normal rate.  Pulmonary:     Effort: Pulmonary effort is normal.  Musculoskeletal:        General: Normal range of motion.     Cervical back: Normal range of motion.  Skin:    General: Skin is warm.  Neurological:     Mental Status: He is alert.     BP 118/74   Pulse 70   Ht _0  (1.676 m)   Wt 185 lb 1.6 oz (84 kg)   BMI 29.88 kg/m  Wt Readings from Last 3 Encounters:  08/21/20 185 lb 1.6 oz (84 kg)  07/30/20 183 lb 14.4 oz (83.4 kg)  01/08/19 179 lb (81.2 kg)    Health Maintenance Due  Topic Date Due  . Hepatitis C Screening  Never done  . COVID-19 Vaccine (1) Never done  . HIV Screening  Never done  . COLONOSCOPY  Never done  . INFLUENZA VACCINE  Never done     There are no preventive care reminders to display for this patient.  CBC Latest Ref Rng & Units 07/28/2020  WBC 3.8 - 10.8 Thousand/uL 6.3  Hemoglobin 13.2 - 17.1 g/dL 16.7  Hematocrit 38 - 50 % 47.9  Platelets 140 - 400 Thousand/uL 210   CMP Latest Ref Rng & Units 07/28/2020  Glucose 65 - 99 mg/dL 81  BUN 7 - 25 mg/dL 11  Creatinine 0.70 - 1.25 mg/dL 0.71  Sodium 135 - 146 mmol/L 142  Potassium 3.5 - 5.3 mmol/L 4.4  Chloride 98 - 110 mmol/L 104  CO2 20 - 32 mmol/L 22  Calcium 8.6 - 10.3 mg/dL 8.9  Total Protein 6.1 - 8.1 g/dL 6.8  Total Bilirubin 0.2 - 1.2 mg/dL 0.8  AST 10 - 35 U/L 25  ALT 9 - 46 U/L 26    Lab Results  Component Value Date   TSH 3.24 07/28/2020   No results found for: ALBUMIN, ANIONGAP, EGFR, GFR No results found for: CHOL, HDL, LDLCALC, CHOLHDL No results found for: TRIG Lab Results  Component Value Date   HGBA1C 5.3 07/28/2020      ASSESSMENT & PLAN:   Problem List Items Addressed This Visit      Other   Elevated blood pressure reading - Primary    Blood pressure normal today with manual reading.       Cigarette smoker    Smokes 1 ppd, discussed Chantix and 800quitnow         No orders of the defined types were placed in this encounter.     Follow-up: No follow-ups on file.    Beckie Salts, [place] [address] [place], [place] [address]

## 2020-08-21 NOTE — Assessment & Plan Note (Signed)
Smokes 1 ppd, discussed Chantix and 800quitnow

## 2020-08-21 NOTE — Assessment & Plan Note (Signed)
Blood pressure normal today with manual reading.

## 2020-08-26 ENCOUNTER — Telehealth: Payer: Self-pay | Admitting: *Deleted

## 2020-08-26 DIAGNOSIS — Z122 Encounter for screening for malignant neoplasm of respiratory organs: Secondary | ICD-10-CM

## 2020-08-26 DIAGNOSIS — Z87891 Personal history of nicotine dependence: Secondary | ICD-10-CM

## 2020-08-26 NOTE — Telephone Encounter (Signed)
Received referral for initial lung cancer screening scan. Contacted patient and obtained smoking history,(current, 44 pack year) as well as answering questions related to screening process. Patient denies signs of lung cancer such as weight loss or hemoptysis. Patient denies comorbidity that would prevent curative treatment if lung cancer were found. Patient is scheduled for shared decision making visit and CT scan on 09/02/20 at 1015am.

## 2020-09-02 ENCOUNTER — Ambulatory Visit
Admission: RE | Admit: 2020-09-02 | Discharge: 2020-09-02 | Disposition: A | Discharge status: Home / Self Care | Payer: BC Managed Care – PPO | Source: Ambulatory Visit | Attending: Oncology | Admitting: Oncology

## 2020-09-02 ENCOUNTER — Encounter: Payer: Self-pay | Admitting: Oncology

## 2020-09-02 ENCOUNTER — Other Ambulatory Visit: Payer: Self-pay

## 2020-09-02 ENCOUNTER — Inpatient Hospital Stay: Payer: BC Managed Care – PPO | Attending: Oncology | Admitting: Oncology

## 2020-09-02 DIAGNOSIS — Z87891 Personal history of nicotine dependence: Secondary | ICD-10-CM | POA: Insufficient documentation

## 2020-09-02 DIAGNOSIS — Z122 Encounter for screening for malignant neoplasm of respiratory organs: Secondary | ICD-10-CM | POA: Diagnosis not present

## 2020-09-02 DIAGNOSIS — F17210 Nicotine dependence, cigarettes, uncomplicated: Secondary | ICD-10-CM | POA: Diagnosis not present

## 2020-09-02 NOTE — Progress Notes (Signed)
Virtual Visit via Video Note  I connected with Mr. Umholtz on 09/02/20 at 10:15 AM EST by a video enabled telemedicine application and verified that I am speaking with the correct person using two identifiers.  Location: Patient: [facility]  Provider: Clinic   I discussed the limitations of evaluation and management by telemedicine and the availability of in person appointments. The patient expressed understanding and agreed to proceed.  I discussed the assessment and treatment plan with the patient. The patient was provided an opportunity to ask questions and all were answered. The patient agreed with the plan and demonstrated an understanding of the instructions.   The patient was advised to call back or seek an in-person evaluation if the symptoms worsen or if the condition fails to improve as anticipated.   In accordance with CMS guidelines, patient has met eligibility criteria including age, absence of signs or symptoms of lung cancer.  Social History   Tobacco Use  . Smoking status: Current Every Day Smoker    Packs/day: 1.00    Years: 44.00    Pack years: 44.00    Types: Cigarettes  . Smokeless tobacco: Current User    Types: Snuff  Vaping Use  . Vaping Use: Never used  Substance Use Topics  . Alcohol use: No  . Drug use: No      A shared decision-making session was conducted prior to the performance of CT scan. This includes one or more decision aids, includes benefits and harms of screening, follow-up diagnostic testing, over-diagnosis, false positive rate, and total radiation exposure.   Counseling on the importance of adherence to annual lung cancer LDCT screening, impact of co-morbidities, and ability or willingness to undergo diagnosis and treatment is imperative for compliance of the program.   Counseling on the importance of continued smoking cessation for former smokers; the importance of smoking cessation for current smokers, and information about tobacco cessation  interventions have been given to patient including [place] and 1800 quit [place] programs.   Written order for lung cancer screening with LDCT has been given to the patient and any and all questions have been answered to the best of my abilities.    Yearly follow up will be coordinated by Burgess Estelle, Thoracic Navigator.  I provided 15 minutes of face-to-face video visit time during this encounter, and > 50% was spent counseling as documented under my assessment & plan.   Jacquelin Hawking, NP

## 2020-09-03 ENCOUNTER — Encounter: Payer: Self-pay | Admitting: *Deleted

## 2020-09-09 ENCOUNTER — Other Ambulatory Visit: Payer: Self-pay

## 2020-09-09 ENCOUNTER — Other Ambulatory Visit
Admission: RE | Admit: 2020-09-09 | Discharge: 2020-09-09 | Disposition: A | Discharge status: Home / Self Care | Payer: BC Managed Care – PPO | Source: Ambulatory Visit | Attending: Gastroenterology | Admitting: Gastroenterology

## 2020-09-09 DIAGNOSIS — K5730 Diverticulosis of large intestine without perforation or abscess without bleeding: Secondary | ICD-10-CM | POA: Diagnosis not present

## 2020-09-09 DIAGNOSIS — K6289 Other specified diseases of anus and rectum: Secondary | ICD-10-CM | POA: Diagnosis not present

## 2020-09-09 DIAGNOSIS — Z1211 Encounter for screening for malignant neoplasm of colon: Secondary | ICD-10-CM | POA: Diagnosis not present

## 2020-09-09 DIAGNOSIS — D125 Benign neoplasm of sigmoid colon: Secondary | ICD-10-CM | POA: Diagnosis not present

## 2020-09-09 DIAGNOSIS — Z20822 Contact with and (suspected) exposure to covid-19: Secondary | ICD-10-CM | POA: Insufficient documentation

## 2020-09-09 DIAGNOSIS — D124 Benign neoplasm of descending colon: Secondary | ICD-10-CM | POA: Diagnosis not present

## 2020-09-09 DIAGNOSIS — Z79899 Other long term (current) drug therapy: Secondary | ICD-10-CM | POA: Diagnosis not present

## 2020-09-09 DIAGNOSIS — D120 Benign neoplasm of cecum: Secondary | ICD-10-CM | POA: Diagnosis not present

## 2020-09-09 DIAGNOSIS — Z01812 Encounter for preprocedural laboratory examination: Secondary | ICD-10-CM | POA: Insufficient documentation

## 2020-09-09 DIAGNOSIS — K635 Polyp of colon: Secondary | ICD-10-CM | POA: Diagnosis not present

## 2020-09-09 DIAGNOSIS — F17210 Nicotine dependence, cigarettes, uncomplicated: Secondary | ICD-10-CM | POA: Diagnosis not present

## 2020-09-09 LAB — SARS CORONAVIRUS 2 (TAT 6-24 HRS): SARS Coronavirus 2: NEGATIVE

## 2020-09-11 ENCOUNTER — Encounter: Payer: Self-pay | Admitting: Gastroenterology

## 2020-09-11 ENCOUNTER — Other Ambulatory Visit: Payer: Self-pay

## 2020-09-11 ENCOUNTER — Encounter
Admission: RE | Disposition: A | Discharge status: Home / Self Care | Payer: Self-pay | Source: Home / Self Care | Attending: Gastroenterology

## 2020-09-11 ENCOUNTER — Ambulatory Visit
Admission: RE | Admit: 2020-09-11 | Discharge: 2020-09-11 | Disposition: A | Discharge status: Home / Self Care | Payer: BC Managed Care – PPO | Attending: Gastroenterology | Admitting: Gastroenterology

## 2020-09-11 ENCOUNTER — Ambulatory Visit: Payer: BC Managed Care – PPO | Admitting: Certified Registered Nurse Anesthetist

## 2020-09-11 DIAGNOSIS — K6289 Other specified diseases of anus and rectum: Secondary | ICD-10-CM | POA: Diagnosis not present

## 2020-09-11 DIAGNOSIS — D124 Benign neoplasm of descending colon: Secondary | ICD-10-CM | POA: Insufficient documentation

## 2020-09-11 DIAGNOSIS — Z79899 Other long term (current) drug therapy: Secondary | ICD-10-CM | POA: Insufficient documentation

## 2020-09-11 DIAGNOSIS — D122 Benign neoplasm of ascending colon: Secondary | ICD-10-CM | POA: Diagnosis not present

## 2020-09-11 DIAGNOSIS — Z1211 Encounter for screening for malignant neoplasm of colon: Secondary | ICD-10-CM | POA: Diagnosis not present

## 2020-09-11 DIAGNOSIS — D125 Benign neoplasm of sigmoid colon: Secondary | ICD-10-CM | POA: Insufficient documentation

## 2020-09-11 DIAGNOSIS — K5730 Diverticulosis of large intestine without perforation or abscess without bleeding: Secondary | ICD-10-CM | POA: Insufficient documentation

## 2020-09-11 DIAGNOSIS — K5790 Diverticulosis of intestine, part unspecified, without perforation or abscess without bleeding: Secondary | ICD-10-CM | POA: Diagnosis not present

## 2020-09-11 DIAGNOSIS — F17210 Nicotine dependence, cigarettes, uncomplicated: Secondary | ICD-10-CM | POA: Insufficient documentation

## 2020-09-11 DIAGNOSIS — Z20822 Contact with and (suspected) exposure to covid-19: Secondary | ICD-10-CM | POA: Insufficient documentation

## 2020-09-11 DIAGNOSIS — K635 Polyp of colon: Secondary | ICD-10-CM | POA: Insufficient documentation

## 2020-09-11 DIAGNOSIS — D120 Benign neoplasm of cecum: Secondary | ICD-10-CM | POA: Diagnosis not present

## 2020-09-11 HISTORY — PX: COLONOSCOPY WITH PROPOFOL: SHX5780

## 2020-09-11 SURGERY — COLONOSCOPY WITH PROPOFOL
Anesthesia: General

## 2020-09-11 MED ORDER — PROPOFOL 500 MG/50ML IV EMUL
INTRAVENOUS | Status: DC | PRN
  Administered 2020-09-11: 180 ug/kg/min via INTRAVENOUS

## 2020-09-11 MED ORDER — LIDOCAINE HCL (CARDIAC) PF 100 MG/5ML IV SOSY
PREFILLED_SYRINGE | INTRAVENOUS | Status: DC | PRN
  Administered 2020-09-11: 50 mg via INTRAVENOUS

## 2020-09-11 MED ORDER — PROPOFOL 500 MG/50ML IV EMUL
INTRAVENOUS | Status: AC
  Filled 2020-09-11: qty 50

## 2020-09-11 MED ORDER — SODIUM CHLORIDE 0.9 % IV SOLN
INTRAVENOUS | Status: DC
  Administered 2020-09-11: 20 mL/h via INTRAVENOUS

## 2020-09-11 MED ORDER — PROPOFOL 10 MG/ML IV BOLUS
INTRAVENOUS | Status: DC | PRN
  Administered 2020-09-11: 60 mg via INTRAVENOUS
  Administered 2020-09-11: 20 mg via INTRAVENOUS

## 2020-09-11 NOTE — Anesthesia Preprocedure Evaluation (Signed)
Anesthesia Evaluation  Patient identified by MRN, date of birth, ID band Patient awake    Reviewed: Allergy & Precautions, H&P , NPO status , Patient's Chart, lab work & pertinent test results, reviewed documented beta blocker date and time   History of Anesthesia Complications Negative for: history of anesthetic complications  Airway Mallampati: III  TM Distance: >3 FB Neck ROM: full    Dental  (+) Dental Advidsory Given, Partial Upper, Poor Dentition   Pulmonary neg shortness of breath, neg sleep apnea, neg COPD, neg recent URI, Current Smoker and Patient abstained from smoking.,    Pulmonary exam normal breath sounds clear to auscultation       Cardiovascular Exercise Tolerance: Good negative cardio ROS Normal cardiovascular exam Rhythm:regular Rate:Normal     Neuro/Psych negative neurological ROS  negative psych ROS   GI/Hepatic negative GI ROS, Neg liver ROS,   Endo/Other  negative endocrine ROS  Renal/GU negative Renal ROS  negative genitourinary   Musculoskeletal   Abdominal   Peds  Hematology negative hematology ROS (+)   Anesthesia Other Findings History reviewed. No pertinent past medical history.   Reproductive/Obstetrics negative OB ROS                             Anesthesia Physical Anesthesia Plan  ASA: II  Anesthesia Plan: General   Post-op Pain Management:    Induction: Intravenous  PONV Risk Score and Plan: 1 and Propofol infusion and TIVA  Airway Management Planned: Natural Airway and Nasal Cannula  Additional Equipment:   Intra-op Plan:   Post-operative Plan:   Informed Consent: I have reviewed the patients History and Physical, chart, labs and discussed the procedure including the risks, benefits and alternatives for the proposed anesthesia with the patient or authorized representative who has indicated his/her understanding and acceptance.      Dental Advisory Given  Plan Discussed with: Anesthesiologist, CRNA and Surgeon  Anesthesia Plan Comments:         Anesthesia Quick Evaluation

## 2020-09-11 NOTE — Transfer of Care (Signed)
Immediate Anesthesia Transfer of Care Note  Patient: Larry Moses  Procedure(s) Performed: COLONOSCOPY WITH PROPOFOL (N/A )  Patient Location: PACU and Endoscopy Unit  Anesthesia Type:General  Level of Consciousness: awake, alert  and oriented  Airway & Oxygen Therapy: Patient Spontanous Breathing  Post-op Assessment: Report given to RN and Post -op Vital signs reviewed and stable  Post vital signs: Reviewed and stable  Last Vitals:  Vitals Value Taken Time  BP 87/56 09/11/20 0943  Temp 36.6 C 09/11/20 0940  Pulse 73 09/11/20 0943  Resp 21 09/11/20 0943  SpO2 97 % 09/11/20 0943  Vitals shown include unvalidated device data.  Last Pain:  Vitals:   09/11/20 0940  TempSrc: Temporal  PainSc: 0-No pain         Complications: No complications documented.

## 2020-09-11 NOTE — Op Note (Signed)
[facility] Patient Name: Larry Moses Procedure Date: 09/11/2020 8:36 AM MRN: [id number] Account #: [id number] Date of Birth: August 22, 1961 Admit Type: Outpatient Age: 59 Room: [facility] ENDO ROOM 3 Gender: Male Note Status: Finalized Procedure:             Colonoscopy Indications:           Screening for colorectal malignant neoplasm Providers:             Lisanne Ponce B. Bonna Gains MD, MD Referring MD:          Cletis Athens, MD (Referring MD) Medicines:             Monitored Anesthesia Care Complications:         No immediate complications. Procedure:             Pre-Anesthesia Assessment:                        - ASA Grade Assessment: II - A patient with mild                         systemic disease.                        - Prior to the procedure, a History and Physical was                         performed, and patient medications, allergies and                         sensitivities were reviewed. The patient's tolerance                         of previous anesthesia was reviewed.                        - The risks and benefits of the procedure and the                         sedation options and risks were discussed with the                         patient. All questions were answered and informed                         consent was obtained.                        - Patient identification and proposed procedure were                         verified prior to the procedure by the physician, the                         nurse, the anesthesiologist, the anesthetist and the                         technician. The procedure was verified in the                         procedure room.  After obtaining informed consent, the colonoscope was                         passed under direct vision. Throughout the procedure,                         the patient's blood pressure, pulse, and oxygen                         saturations were monitored  continuously. The                         Colonoscope was introduced through the anus and                         advanced to the the cecum, identified by appendiceal                         orifice and ileocecal valve. The colonoscopy was                         performed with ease. The patient tolerated the                         procedure well. The quality of the bowel preparation                         was fair. Findings:      The perianal and digital rectal examinations were normal.      Three sessile polyps were found in the ascending colon and cecum. The       polyps were 6 to 8 mm in size. These polyps were removed with a cold       snare. Resection and retrieval were complete.      A 15 mm polyp was found in the ascending colon. The polyp was sessile.       Area was successfully injected with 3 mL Eleview for lesion assessment,       and this injection appeared to lift the lesion adequately. The polyp was       removed with a hot snare. Resection and retrieval were complete. It was       captured in a net for retrieval but broke into several pieces when being       pulled into the channel within the roth net.      A 7 mm polyp was found in the descending colon. The polyp was sessile.       The polyp was removed with a cold snare. Resection and retrieval were       complete.      A 7 mm, non-bleeding polyp was found in the sigmoid colon. The polyp was       semi-pedunculated. Biopsies were taken with a cold forceps for       histology. It was not removed as it appeared to be inflammatory with       central umbilication.      Nine sessile polyps were found in the sigmoid colon. The polyps were 5       to 8 mm in size. These polyps were removed with a cold snare. Resection       and  retrieval were complete.      Multiple diverticula were found in the sigmoid colon.      The exam was otherwise without abnormality.      The rectum, sigmoid colon, descending colon, transverse  colon, ascending       colon and cecum appeared normal.      Anal papilla(e) were hypertrophied.      No additional abnormalities were found on retroflexion. Impression:            - Preparation of the colon was fair.                        - Three 6 to 8 mm polyps in the ascending colon and in                         the cecum, removed with a cold snare. Resected and                         retrieved.                        - One 15 mm polyp in the ascending colon, removed with                         a hot snare. Resected and retrieved. Injected.                        - One 7 mm polyp in the descending colon, removed with                         a cold snare. Resected and retrieved.                        - One 7 mm, non-bleeding polyp in the sigmoid colon.                         Biopsied.                        - Nine 5 to 8 mm polyps in the sigmoid colon, removed                         with a cold snare. Resected and retrieved.                        - Diverticulosis in the sigmoid colon.                        - The examination was otherwise normal.                        - The rectum, sigmoid colon, descending colon,                         transverse colon, ascending colon and cecum are normal.                        - Anal papilla(e) were hypertrophied. Recommendation:        - Discharge patient to  home (with escort).                        - Advance diet as tolerated.                        - Continue present medications.                        - Await pathology results.                        - Repeat colonoscopy date to be determined after                         pending pathology results are reviewed.                        - The findings and recommendations were discussed with                         the patient.                        - The findings and recommendations were discussed with                         the patient's family.                        - Return to  primary care physician as previously                         scheduled.                        - High fiber diet. Procedure Code(s):     --- Professional ---                        [phone], Colonoscopy, flexible; with removal of                         tumor(s), polyp(s), or other lesion(s) by snare                         technique                        45381, Colonoscopy, flexible; with directed submucosal                         injection(s), any substance                        67893, 59, Colonoscopy, flexible; with biopsy, single                         or multiple Diagnosis Code(s):     --- Professional ---                        K63.5, Polyp of colon  Z12.11, Encounter for screening for malignant neoplasm                         of colon CPT copyright 2019 American [employer]. All rights reserved. The codes documented in this report are preliminary and upon coder review may  be revised to meet current compliance requirements.  Vonda Antigua, MD Margretta Sidle B. Bonna Gains MD, MD 09/11/2020 9:46:07 AM This report has been signed electronically. Number of Addenda: 0 Note Initiated On: 09/11/2020 8:36 AM Scope Withdrawal Time: 0 hours 47 minutes 12 seconds  Total Procedure Duration: 0 hours 51 minutes 31 seconds  Estimated Blood Loss:  Estimated blood loss: none.      [facility]

## 2020-09-11 NOTE — Anesthesia Postprocedure Evaluation (Signed)
Anesthesia Post Note  Patient: Larry Moses  Procedure(s) Performed: COLONOSCOPY WITH PROPOFOL (N/A )  Patient location during evaluation: Endoscopy Anesthesia Type: General Level of consciousness: awake and alert Pain management: pain level controlled Vital Signs Assessment: post-procedure vital signs reviewed and stable Respiratory status: spontaneous breathing, nonlabored ventilation, respiratory function stable and patient connected to nasal cannula oxygen Cardiovascular status: blood pressure returned to baseline and stable Postop Assessment: no apparent nausea or vomiting Anesthetic complications: no   No complications documented.   Last Vitals:  Vitals:   09/11/20 1000 09/11/20 1010  BP: (!) 156/93 (!) 150/86  Pulse: 65 66  Resp: 18 20  Temp:    SpO2: 97% 100%    Last Pain:  Vitals:   09/11/20 1010  TempSrc:   PainSc: 0-No pain                 Martha Clan

## 2020-09-11 NOTE — H&P (Signed)
  Larry Antigua, MD [address], [place], [place], [place], [address] [address], [place], [place], [place], [address] Phone: [phone]  Fax: [phone]  Primary Care Physician:  Cletis Athens, MD   Pre-Procedure History & Physical: HPI:  Larry Moses is a 59 y.o. male is here for a colonoscopy.   No past medical history on file.  No past surgical history on file.  Prior to Admission medications   Medication Sig Start Date End Date Taking? Authorizing Provider  diclofenac (VOLTAREN) 75 MG EC tablet diclofenac sodium 75 mg tablet,delayed release  TAKE 1 TABLET BY MOUTH TWICE A DAY   Yes [provider]    Allergies as of 08/13/2020  . (No Known Allergies)    Family History  Problem Relation Age of Onset  . Ovarian cancer Mother   . Breast cancer Mother   . Lung cancer Maternal Grandfather     Social History   Socioeconomic History  . Marital status: Married    Spouse name: Not on file  . Number of children: Not on file  . Years of education: Not on file  . Highest education level: Not on file  Occupational History  . Not on file  Tobacco Use  . Smoking status: Current Every Day Smoker    Packs/day: 1.00    Years: 44.00    Pack years: 44.00    Types: Cigarettes  . Smokeless tobacco: Current User    Types: Snuff  Vaping Use  . Vaping Use: Never used  Substance and Sexual Activity  . Alcohol use: No  . Drug use: No  . Sexual activity: Yes  Other Topics Concern  . Not on file  Social History Narrative  . Not on file   Social Determinants of Health   Financial Resource Strain: Not on file  Food Insecurity: Not on file  Transportation Needs: Not on file  Physical Activity: Not on file  Stress: Not on file  Social Connections: Not on file  Intimate Partner Violence: Not on file    Review of Systems: See HPI, otherwise negative ROS  Physical Exam: BP (!) 157/94   Pulse 72   Temp (!) 96.4 F (35.8 C) (Temporal)   Resp  20   Ht 5\' 6"  (1.676 m)   Wt 81.6 kg   SpO2 99%   BMI 29.05 kg/m  General:   Alert,  pleasant and cooperative in NAD Head:  Normocephalic and atraumatic. Neck:  Supple; no masses or thyromegaly. Lungs:  Clear throughout to auscultation, normal respiratory effort.    Heart:  +S1, +S2, Regular rate and rhythm, No edema. Abdomen:  Soft, nontender and nondistended. Normal bowel sounds, without guarding, and without rebound.   Neurologic:  Alert and  oriented x4;  grossly normal neurologically.  Impression/Plan: Larry Moses is here for a colonoscopy to be performed for average risk screening.  Risks, benefits, limitations, and alternatives regarding  colonoscopy have been reviewed with the patient.  Questions have been answered.  All parties agreeable.   Virgel Manifold, MD  09/11/2020, 8:26 AM

## 2020-09-14 ENCOUNTER — Encounter: Payer: Self-pay | Admitting: Gastroenterology

## 2020-09-14 LAB — SURGICAL PATHOLOGY

## 2020-09-17 ENCOUNTER — Encounter: Payer: Self-pay | Admitting: Gastroenterology

## 2020-11-10 ENCOUNTER — Ambulatory Visit (INDEPENDENT_AMBULATORY_CARE_PROVIDER_SITE_OTHER): Payer: BC Managed Care – PPO

## 2020-11-10 DIAGNOSIS — Z20822 Contact with and (suspected) exposure to covid-19: Secondary | ICD-10-CM

## 2020-11-10 LAB — POC COVID19 BINAXNOW: SARS Coronavirus 2 Ag: NEGATIVE

## 2020-11-11 ENCOUNTER — Other Ambulatory Visit: Payer: BC Managed Care – PPO

## 2020-11-11 DIAGNOSIS — Z20822 Contact with and (suspected) exposure to covid-19: Secondary | ICD-10-CM

## 2020-11-12 LAB — SARS-COV-2, NAA 2 DAY TAT

## 2020-11-12 LAB — NOVEL CORONAVIRUS, NAA: SARS-CoV-2, NAA: NOT DETECTED

## 2021-02-18 ENCOUNTER — Ambulatory Visit: Payer: BC Managed Care – PPO | Admitting: Family Medicine

## 2021-06-25 IMAGING — CT CT CHEST LUNG CANCER SCREENING LOW DOSE W/O CM
2 of 5 series · 15 of 40 positions shown, 18 images · non-contrast
Comparison: None.

CLINICAL DATA: Lung cancer screening. Forty-four pack-year history.
Current asymptomatic smoker.

EXAM:
CT CHEST WITHOUT CONTRAST LOW-DOSE FOR LUNG CANCER SCREENING
TECHNIQUE: Multidetector CT imaging of the chest was performed following the
standard protocol without IV contrast.

[Series 3: lung 1.00 · axial · 0.69mm/px · z∈[-1199,-907]mm · 12 of 322 slices shown, 15 images]
[im 15/322  mediastinal]
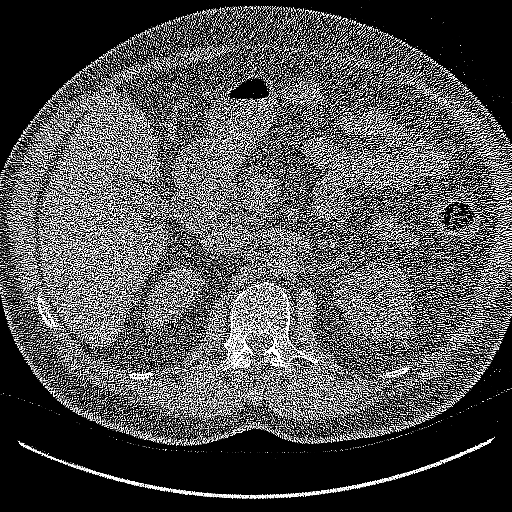
[im 15/322  lung]
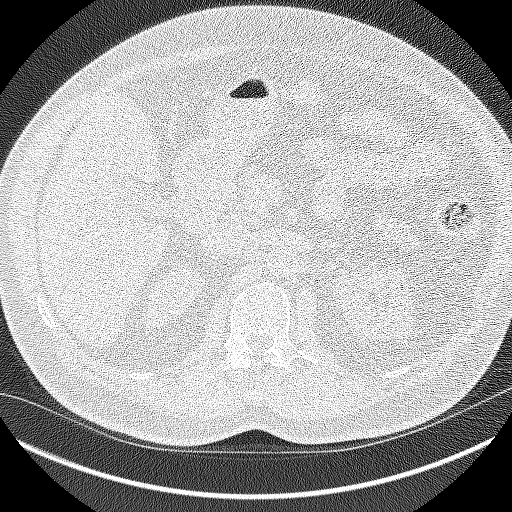
[im 44/322  lung]
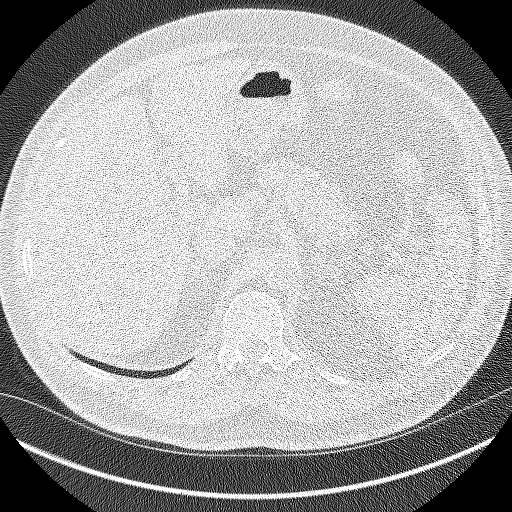
[im 73/322  lung]
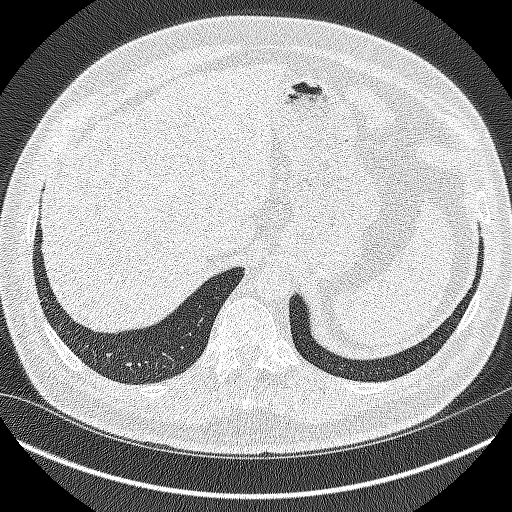
[im 103/322  lung]
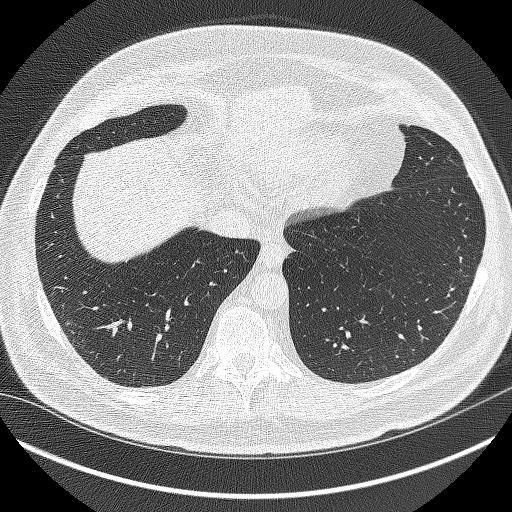
[im 117/322  mediastinal]
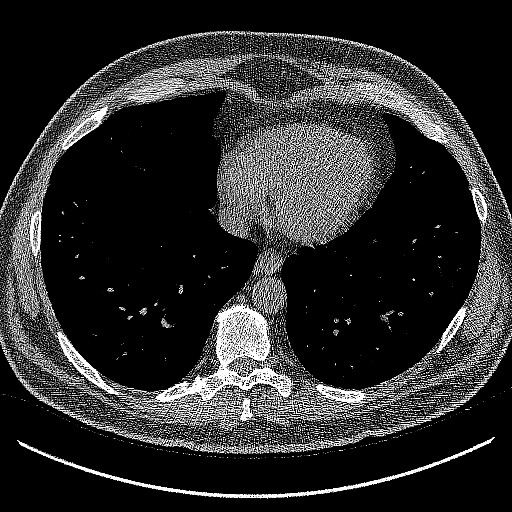
[im 117/322  lung]
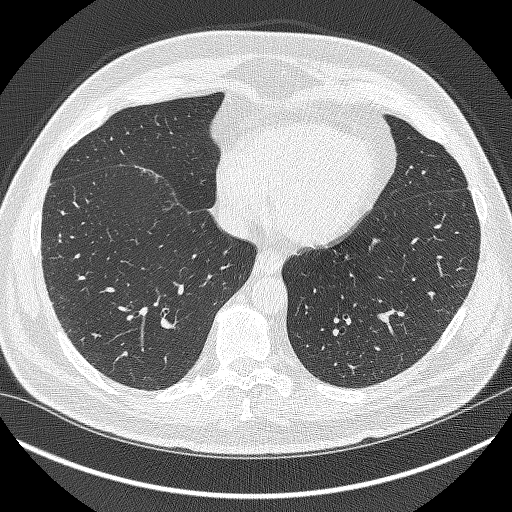
[im 146/322  lung]
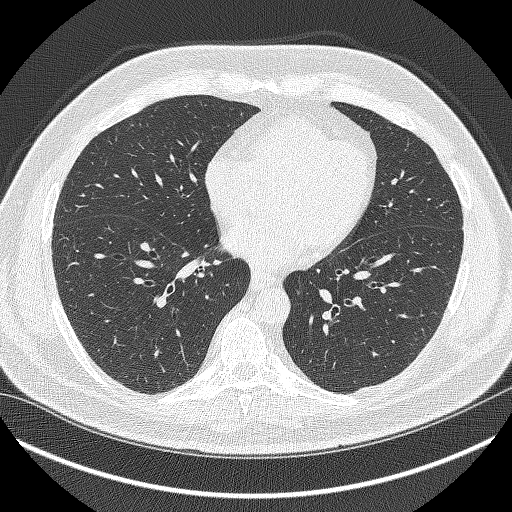
[im 176/322  lung]
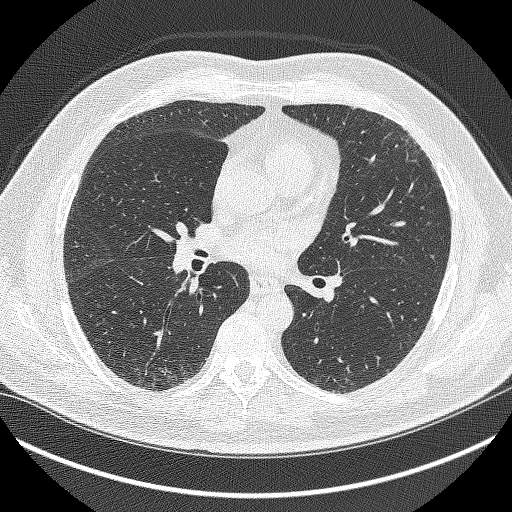
[im 205/322  lung]
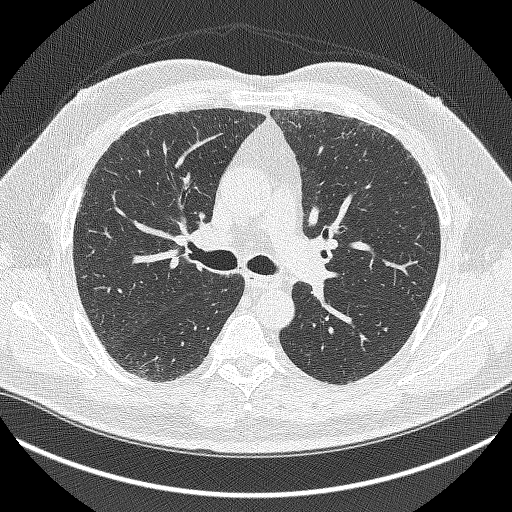
[im 219/322  mediastinal]
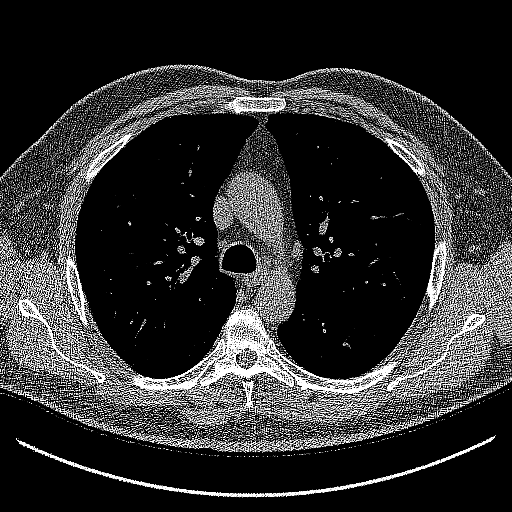
[im 219/322  lung]
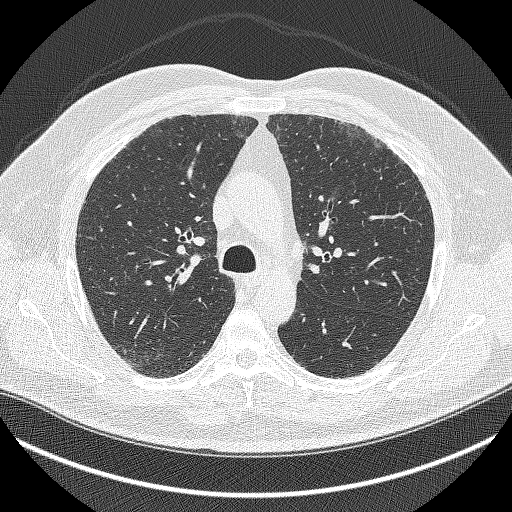
[im 249/322  lung]
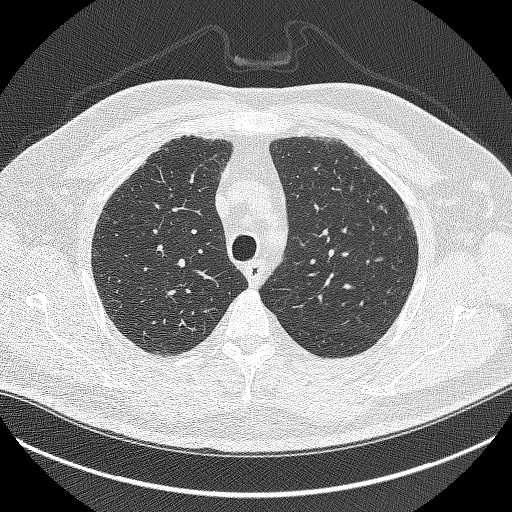
[im 278/322  lung]
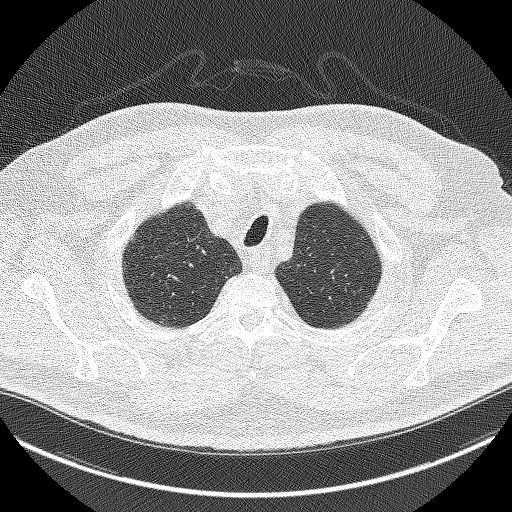
[im 307/322  lung]
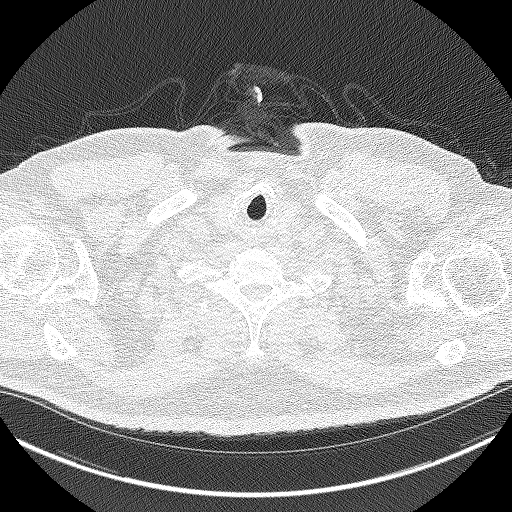

[Series 4: coronals lung 1.00 cor · coronal · 0.63mm/px · 3 of 304 slices shown]
[im 61/304  lung]
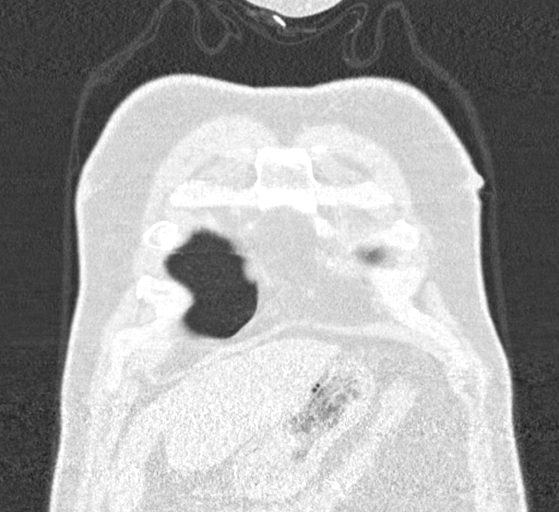
[im 122/304  lung]
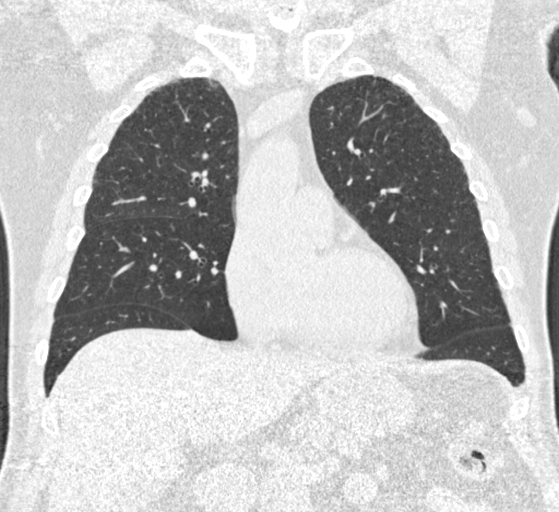
[im 182/304  lung]
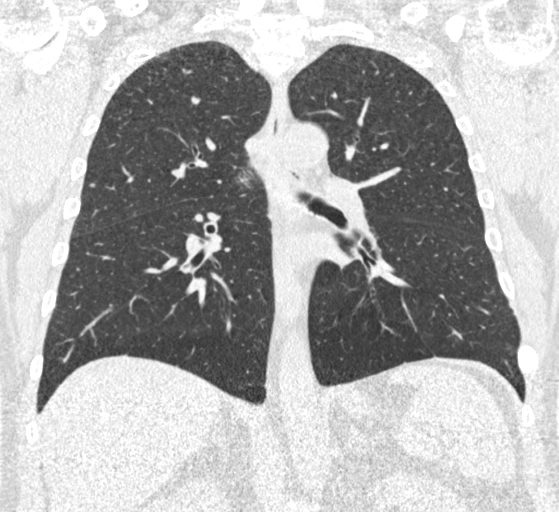

[15 of 40 positions shown; findings below may reference images not displayed]

FINDINGS: Cardiovascular: Normal heart size. No pericardial effusion. Aortic
atherosclerosis. Coronary artery calcifications.

Mediastinum/Nodes: No enlarged mediastinal, hilar, or axillary lymph
nodes. Thyroid gland, trachea, and esophagus demonstrate no
significant findings.

Lungs/Pleura: Mild paraseptal and central lobular emphysema. Diffuse
bronchial wall thickening. No pleural effusion, airspace
consolidation, or atelectasis. Several small lung nodules are
identified. The largest is in the lateral aspect of the superior
segment of left lower lobe. This has an equivalent diameter of
mm.

Upper Abdomen: No acute abnormality.

Musculoskeletal: No chest wall mass or suspicious bone lesions
identified.
IMPRESSION: 1. Lung-RADS 2, benign appearance or behavior.
2. Coronary artery calcifications noted.

Aortic Atherosclerosis (29CIC-JUT.T) and Emphysema (29CIC-OSN.6).

## 2021-12-21 ENCOUNTER — Telehealth: Payer: Self-pay | Admitting: Acute Care

## 2021-12-21 NOTE — Telephone Encounter (Signed)
Called to schedule annual LDCT.  No answer and VM is not set up so unable to leave message ?

## 2022-05-24 LAB — LIPID PANEL
LDL Cholesterol: 102
Triglycerides: 202 — AB (ref 40–160)

## 2022-05-25 LAB — BASIC METABOLIC PANEL
Creatinine: 0.8 (ref <=1.3)
Glucose: 120

## 2022-05-25 LAB — HEMOGLOBIN A1C: Hemoglobin A1C: 5.8

## 2022-05-25 LAB — LIPID PANEL: HDL: 4 — AB (ref 35–70)

## 2022-07-18 ENCOUNTER — Encounter: Payer: Self-pay | Admitting: Internal Medicine

## 2022-07-18 ENCOUNTER — Ambulatory Visit (INDEPENDENT_AMBULATORY_CARE_PROVIDER_SITE_OTHER): Payer: Self-pay | Admitting: Internal Medicine

## 2022-07-18 VITALS — BP 143/92 | HR 76 | Ht 66.0 in | Wt 204.2 lb

## 2022-07-18 DIAGNOSIS — F17210 Nicotine dependence, cigarettes, uncomplicated: Secondary | ICD-10-CM

## 2022-07-18 DIAGNOSIS — R030 Elevated blood-pressure reading, without diagnosis of hypertension: Secondary | ICD-10-CM

## 2022-07-18 DIAGNOSIS — F1010 Alcohol abuse, uncomplicated: Secondary | ICD-10-CM | POA: Insufficient documentation

## 2022-07-18 DIAGNOSIS — Z1211 Encounter for screening for malignant neoplasm of colon: Secondary | ICD-10-CM

## 2022-07-18 DIAGNOSIS — Z720 Tobacco use: Secondary | ICD-10-CM

## 2022-07-18 MED ORDER — LOSARTAN POTASSIUM 25 MG PO TABS: 25.0000 mg | ORAL_TABLET | Freq: Every day | ORAL | 2 refills | Status: DC

## 2022-07-18 NOTE — Assessment & Plan Note (Signed)
Patient was advised to stop drinking 

## 2022-07-18 NOTE — Assessment & Plan Note (Signed)
Colonoscopy has been done

## 2022-07-18 NOTE — Progress Notes (Signed)
Established Patient Office Visit  Subjective:  Patient ID: Larry Moses, male    DOB: 1960/10/05  Age: 61 y.o. MRN: [id number]  CC:  Chief Complaint  Patient presents with   Hypertension    Hypertension    History reviewed. No pertinent past medical history.  Past Surgical History:  Procedure Laterality Date   COLONOSCOPY WITH PROPOFOL N/A 09/11/2020   Procedure: COLONOSCOPY WITH PROPOFOL;  Surgeon: Virgel Manifold, MD;  Location: [facility] ENDOSCOPY;  Service: Endoscopy;  Laterality: N/A;    Family History  Problem Relation Age of Onset   Ovarian cancer Mother    Breast cancer Mother    Lung cancer Maternal Grandfather     Social History   Socioeconomic History   Marital status: Married    Spouse name: Not on file   Number of children: Not on file   Years of education: Not on file   Highest education level: Not on file  Occupational History   Not on file  Tobacco Use   Smoking status: Every Day    Packs/day: 1.00    Years: 44.00    Total pack years: 44.00    Types: Cigarettes   Smokeless tobacco: Current    Types: Snuff  Vaping Use   Vaping Use: Never used  Substance and Sexual Activity   Alcohol use: No   Drug use: No   Sexual activity: Yes  Other Topics Concern   Not on file  Social History Narrative   Not on file   Social Determinants of Health   Financial Resource Strain: Not on file  Food Insecurity: Not on file  Transportation Needs: Not on file  Physical Activity: Not on file  Stress: Not on file  Social Connections: Not on file  Intimate Partner Violence: Not on file    No current outpatient medications on file.   No Known Allergies  ROS Review of Systems  Constitutional: Negative.   HENT: Negative.    Eyes: Negative.   Respiratory: Negative.    Cardiovascular: Negative.   Gastrointestinal: Negative.   Endocrine: Negative.   Genitourinary: Negative.   Musculoskeletal: Negative.   Skin: Negative.    Allergic/Immunologic: Negative.   Neurological: Negative.   Hematological: Negative.   Psychiatric/Behavioral: Negative.    All other systems reviewed and are negative.     Objective:    Physical Exam Vitals reviewed.  Constitutional:      Appearance: Normal appearance.  HENT:     Mouth/Throat:     Mouth: Mucous membranes are moist.  Eyes:     Pupils: Pupils are equal, round, and reactive to light.  Neck:     Vascular: No carotid bruit.  Cardiovascular:     Rate and Rhythm: Normal rate and regular rhythm.     Pulses: Normal pulses.     Heart sounds: Normal heart sounds.  Pulmonary:     Effort: Pulmonary effort is normal.     Breath sounds: Normal breath sounds.  Abdominal:     General: Bowel sounds are normal.     Palpations: Abdomen is soft. There is no hepatomegaly, splenomegaly or mass.     Tenderness: There is no abdominal tenderness.     Hernia: No hernia is present.  Musculoskeletal:     Cervical back: Neck supple.     Right lower leg: No edema.     Left lower leg: No edema.  Skin:    Findings: No rash.  Neurological:     Mental Status: He  is alert and oriented to person, place, and time.     Motor: No weakness.  Psychiatric:        Mood and Affect: Mood normal.        Behavior: Behavior normal.     BP (!) 143/92   Pulse 76   Ht '5\' 6"'$  (1.676 m)   Wt 204 lb 3.2 oz (92.6 kg)   BMI 32.96 kg/m  Wt Readings from Last 3 Encounters:  07/18/22 204 lb 3.2 oz (92.6 kg)  09/11/20 180 lb (81.6 kg)  09/02/20 180 lb (81.6 kg)     Health Maintenance Due  Topic Date Due   COLONOSCOPY (Pts 45-97yr Insurance coverage will need to be confirmed)  09/11/2021    There are no preventive care reminders to display for this patient.  Lab Results  Component Value Date   TSH 3.24 07/28/2020   Lab Results  Component Value Date   WBC 6.3 07/28/2020   HGB 16.7 07/28/2020   HCT 47.9 07/28/2020   MCV 92.5 07/28/2020   PLT 210 07/28/2020   Lab Results   Component Value Date   NA 142 07/28/2020   K 4.4 07/28/2020   CO2 22 07/28/2020   GLUCOSE 81 07/28/2020   BUN 11 07/28/2020   CREATININE 0.8 05/25/2022   BILITOT 0.8 07/28/2020   AST 25 07/28/2020   ALT 26 07/28/2020   PROT 6.8 07/28/2020   CALCIUM 8.9 07/28/2020   No results found for: "CHOL" Lab Results  Component Value Date   HDL 4 (A) 05/25/2022   Lab Results  Component Value Date   LDLCALC 102 05/24/2022   Lab Results  Component Value Date   TRIG 202 (A) 05/24/2022   No results found for: "CHOLHDL" Lab Results  Component Value Date   HGBA1C 5.8 05/25/2022      Assessment & Plan:   Problem List Items Addressed This Visit       Other   Tobacco abuse    Patient was advised to quit smoking      Elevated blood pressure reading     Patient denies any chest pain or shortness of breath there is no history of palpitation or paroxysmal nocturnal dyspnea   patient was advised to follow low-salt low-cholesterol diet    ideally I want to keep systolic blood pressure below 130 mmHg, patient was asked to check blood pressure one times a week and give me a report on that.  Patient will be follow-up in 3 months  or earlier as needed, patient will call me back for any change in the cardiovascular symptoms Patient was advised to buy a book from local bookstore concerning blood pressure and read several chapters  every day.  This will be supplemented by some of the material we will give him from the office.  Patient should also utilize other resources like YouTube and Internet to learn more about the blood pressure and the diet.      Cigarette smoker    - I instructed the patient to stop smoking and provided them with smoking cessation materials.  - I informed the patient that smoking puts them at increased risk for cancer, COPD, hypertension, and more.  - Informed the patient to seek help if they begin to have trouble breathing, develop chest pain, start to cough up blood,  feel faint, or pass out.      Colon cancer screening - Primary    Colonoscopy has been done      Relevant  Orders   Ambulatory referral to Gastroenterology   Alcohol abuse    Patient was advised to stop drinking       No orders of the defined types were placed in this encounter.   Follow-up: No follow-ups on file.    Cletis Athens, MD

## 2022-07-18 NOTE — Assessment & Plan Note (Signed)
Patient was advised to quit smoking 

## 2022-07-18 NOTE — Assessment & Plan Note (Signed)

## 2022-07-18 NOTE — Assessment & Plan Note (Signed)
-   I instructed the patient to stop smoking and provided them with smoking cessation materials.  - I informed the patient that smoking puts them at increased risk for cancer, COPD, hypertension, and more.  - Informed the patient to seek help if they begin to have trouble breathing, develop chest pain, start to cough up blood, feel faint, or pass out.  

## 2022-09-19 ENCOUNTER — Ambulatory Visit: Payer: Self-pay | Admitting: Internal Medicine

## 2022-10-10 ENCOUNTER — Ambulatory Visit: Payer: Self-pay | Admitting: Internal Medicine

## 2022-12-26 ENCOUNTER — Other Ambulatory Visit: Payer: Self-pay | Admitting: Internal Medicine

## 2022-12-27 LAB — CBC WITH DIFFERENTIAL/PLATELET
Absolute Monocytes: 728 cells/uL (ref 200–950)
Basophils Absolute: 33 cells/uL (ref 0–200)
Basophils Relative: 0.5 %
Eosinophils Absolute: 72 cells/uL (ref 15–500)
Eosinophils Relative: 1.1 %
HCT: 46.2 % (ref 38.5–50.0)
Hemoglobin: 16.1 g/dL (ref 13.2–17.1)
Lymphs Abs: 2152 cells/uL (ref 850–3900)
MCH: 31.6 pg (ref 27.0–33.0)
MCHC: 34.8 g/dL (ref 32.0–36.0)
MCV: 90.6 fL (ref 80.0–100.0)
MPV: 10.9 fL (ref 7.5–12.5)
Monocytes Relative: 11.2 %
Neutro Abs: 3517 cells/uL (ref 1500–7800)
Neutrophils Relative %: 54.1 %
Platelets: 191 10*3/uL (ref 140–400)
RBC: 5.1 10*6/uL (ref 4.20–5.80)
RDW: 13 % (ref 11.0–15.0)
Total Lymphocyte: 33.1 %
WBC: 6.5 10*3/uL (ref 3.8–10.8)

## 2022-12-27 LAB — LIPID PANEL
Cholesterol: 160 mg/dL (ref <200)
HDL: 44 mg/dL (ref >=40)
LDL Cholesterol (Calc): 87 mg/dL (calc)
Non-HDL Cholesterol (Calc): 116 mg/dL (calc) (ref <130)
Total CHOL/HDL Ratio: 3.6 (calc) (ref <5.0)
Triglycerides: 197 mg/dL — ABNORMAL HIGH (ref <150)

## 2022-12-27 LAB — COMPLETE METABOLIC PANEL WITH GFR
AG Ratio: 1.7 (calc) (ref 1.0–2.5)
ALT: 51 U/L — ABNORMAL HIGH (ref 9–46)
AST: 41 U/L — ABNORMAL HIGH (ref 10–35)
Albumin: 4.3 g/dL (ref 3.6–5.1)
Alkaline phosphatase (APISO): 91 U/L (ref 35–144)
BUN/Creatinine Ratio: 18 (calc) (ref 6–22)
BUN: 12 mg/dL (ref 7–25)
CO2: 26 mmol/L (ref 20–32)
Calcium: 8.3 mg/dL — ABNORMAL LOW (ref 8.6–10.3)
Chloride: 105 mmol/L (ref 98–110)
Creat: 0.65 mg/dL — ABNORMAL LOW (ref 0.70–1.35)
Globulin: 2.6 g/dL (calc) (ref 1.9–3.7)
Glucose, Bld: 90 mg/dL (ref 65–99)
Potassium: 4.2 mmol/L (ref 3.5–5.3)
Sodium: 142 mmol/L (ref 135–146)
Total Bilirubin: 0.8 mg/dL (ref 0.2–1.2)
Total Protein: 6.9 g/dL (ref 6.1–8.1)
eGFR: 106 mL/min/{1.73_m2} (ref >=60)

## 2022-12-27 LAB — PSA: PSA: 1.6 ng/mL (ref <=4.00)

## 2022-12-27 LAB — CLIENT EDUCATION TRACKING

## 2022-12-27 LAB — SEDIMENTATION RATE: Sed Rate: 2 mm/h (ref 0–20)

## 2023-05-05 ENCOUNTER — Ambulatory Visit (INDEPENDENT_AMBULATORY_CARE_PROVIDER_SITE_OTHER): Payer: Managed Care, Other (non HMO) | Admitting: Family Medicine

## 2023-05-05 VITALS — BP 127/86 | HR 83 | Ht 66.0 in | Wt 201.6 lb

## 2023-05-05 DIAGNOSIS — M25511 Pain in right shoulder: Secondary | ICD-10-CM

## 2023-05-05 DIAGNOSIS — Z7689 Persons encountering health services in other specified circumstances: Secondary | ICD-10-CM

## 2023-05-05 DIAGNOSIS — R7303 Prediabetes: Secondary | ICD-10-CM | POA: Diagnosis not present

## 2023-05-05 DIAGNOSIS — K635 Polyp of colon: Secondary | ICD-10-CM

## 2023-05-05 DIAGNOSIS — F17200 Nicotine dependence, unspecified, uncomplicated: Secondary | ICD-10-CM

## 2023-05-05 DIAGNOSIS — R42 Dizziness and giddiness: Secondary | ICD-10-CM

## 2023-05-05 DIAGNOSIS — F1090 Alcohol use, unspecified, uncomplicated: Secondary | ICD-10-CM

## 2023-05-05 DIAGNOSIS — I2510 Atherosclerotic heart disease of native coronary artery without angina pectoris: Secondary | ICD-10-CM | POA: Diagnosis not present

## 2023-05-05 DIAGNOSIS — I10 Essential (primary) hypertension: Secondary | ICD-10-CM

## 2023-05-05 NOTE — Progress Notes (Signed)
New patient visit   Patient: Larry Moses   DOB: 07-Apr-1961   62 y.o. Male  MRN: [id number] Visit Date: 05/05/2023  Today's healthcare provider: Jacky Kindle, FNP  Patient presents for new patient visit to establish care.  Introduced to Publishing rights manager role and practice setting.  All questions answered.  Discussed provider/patient relationship and expectations.  Chief Complaint  Patient presents with   New Patient (Initial Visit)    Patient reports dizziness starting Sunday when ever positioning himself different ways. Reports it would only last for a few seconds, associated with blurred vision and loss of balance. Patient reports he has not taking BP medication since Tuesday because he was not sure if that was the cause of episodes. Patient also reports he began having frequent headaches in October just about every day. Patient reports he has rotator cuff pain for 2 months   Dizziness   Subjective    Larry Moses is a 62 y.o. male who presents today as a new patient to establish care.  HPI HPI     New Patient (Initial Visit)    Additional comments: Patient reports dizziness starting Sunday when ever positioning himself different ways. Reports it would only last for a few seconds, associated with blurred vision and loss of balance. Patient reports he has not taking BP medication since Tuesday because he was not sure if that was the cause of episodes. Patient also reports he began having frequent headaches in October just about every day. Patient reports he has rotator cuff pain for 2 months      Last edited by Acey Lav, CMA on 05/05/2023  9:14 AM.       History reviewed. No pertinent past medical history. Past Surgical History:  Procedure Laterality Date   COLONOSCOPY WITH PROPOFOL N/A 09/11/2020   Procedure: COLONOSCOPY WITH PROPOFOL;  Surgeon: Pasty Spillers, MD;  Location: [facility] ENDOSCOPY;  Service: Endoscopy;  Laterality: N/A;   Family Status   Relation Name Status   Father  Deceased   Mother  Deceased   MGF  Deceased  No partnership data on file   Family History  Problem Relation Age of Onset   Ovarian cancer Mother    Breast cancer Mother    Lung cancer Maternal Grandfather    Social History   Socioeconomic History   Marital status: Married    Spouse name: Not on file   Number of children: Not on file   Years of education: Not on file   Highest education level: 12th grade  Occupational History   Not on file  Tobacco Use   Smoking status: Every Day    Current packs/day: 1.00    Average packs/day: 1 pack/day for 44.0 years (44.0 ttl pk-yrs)    Types: Cigarettes   Smokeless tobacco: Current    Types: Snuff  Vaping Use   Vaping status: Never Used  Substance and Sexual Activity   Alcohol use: No   Drug use: No   Sexual activity: Yes  Other Topics Concern   Not on file  Social History Narrative   Not on file   Social Determinants of Health   Financial Resource Strain: Low Risk  (05/04/2023)   Overall Financial Resource Strain (CARDIA)    Difficulty of Paying Living Expenses: Not hard at all  Food Insecurity: No Food Insecurity (05/04/2023)   Hunger Vital Sign    Worried About Running Out of Food in the Last Year: Never true  Ran Out of Food in the Last Year: Never true  Transportation Needs: No Transportation Needs (05/04/2023)   PRAPARE - Administrator, Civil Service (Medical): No    Lack of Transportation (Non-Medical): No  Physical Activity: Unknown (05/04/2023)   Exercise Vital Sign    Days of Exercise per Week: 0 days    Minutes of Exercise per Session: Not on file  Stress: No Stress Concern Present (05/04/2023)   [employer] of Occupational Health - Occupational Stress Questionnaire    Feeling of Stress : Only a little  Social Connections: Moderately Integrated (05/04/2023)   Social Connection and Isolation Panel [NHANES]    Frequency of Communication with Friends and Family: More  than three times a week    Frequency of Social Gatherings with Friends and Family: Once a week    Attends Religious Services: 1 to 4 times per year    Active Member of [employer] or Organizations: No    Attends Engineer, structural: Not on file    Marital Status: Married   Outpatient Medications Prior to Visit  Medication Sig   losartan (COZAAR) 25 MG tablet Take 1 tablet (25 mg total) by mouth daily.   No facility-administered medications prior to visit.   No Known Allergies  Immunization History  Administered Date(s) Administered   Tdap 07/30/2020    Health Maintenance  Topic Date Due   Zoster Vaccines- Shingrix (1 of 2) Never done   Lung Cancer Screening  09/02/2021   Colonoscopy  09/11/2021   COVID-19 Vaccine (1 - 2023-24 season) 07/19/2023 (Originally 06/03/2022)   Hepatitis C Screening  07/19/2023 (Originally 11/20/1977)   HIV Screening  07/19/2023 (Originally 11/20/1974)   INFLUENZA VACCINE  01/01/2024 (Originally 05/04/2023)   DTaP/Tdap/Td (2 - Td or Tdap) 07/30/2030   HPV VACCINES  Aged Out    Patient Care Team: Jacky Kindle, FNP as PCP - General (Family Medicine)  Review of Systems     Objective    BP 127/86 (BP Location: Right Arm, Patient Position: Sitting, Cuff Size: Normal)   Pulse 83   Ht 5\' 6"  (1.676 m)   Wt 201 lb 9.6 oz (91.4 kg)   SpO2 97%   BMI 32.54 kg/m   Physical Exam Vitals and nursing note reviewed.  Constitutional:      General: He is awake. He is not in acute distress.    Appearance: Normal appearance. He is well-developed and well-groomed. He is obese. He is not ill-appearing, toxic-appearing or diaphoretic.  HENT:     Head: Normocephalic and atraumatic.     Jaw: There is normal jaw occlusion. No trismus, tenderness, swelling or pain on movement.     Salivary Glands: Right salivary gland is not diffusely enlarged or tender. Left salivary gland is not diffusely enlarged or tender.     Right Ear: Hearing, tympanic membrane, ear  canal and external ear normal. There is no impacted cerumen.     Left Ear: Hearing, tympanic membrane, ear canal and external ear normal. There is no impacted cerumen.     Nose: Nose normal. No congestion or rhinorrhea.     Right Turbinates: Not enlarged, swollen or pale.     Left Turbinates: Not enlarged, swollen or pale.     Right Sinus: No maxillary sinus tenderness or frontal sinus tenderness.     Left Sinus: No maxillary sinus tenderness or frontal sinus tenderness.     Mouth/Throat:     Lips: Pink.     Mouth:  Mucous membranes are moist. No injury, lacerations, oral lesions or angioedema.     Pharynx: Oropharynx is clear. Uvula midline. No pharyngeal swelling, oropharyngeal exudate or posterior oropharyngeal erythema.     Tonsils: No tonsillar exudate or tonsillar abscesses.  Eyes:     General: Lids are normal. Vision grossly intact. Gaze aligned appropriately.        Right eye: No discharge.        Left eye: No discharge.     Extraocular Movements: Extraocular movements intact.     Conjunctiva/sclera: Conjunctivae normal.     Pupils: Pupils are equal, round, and reactive to light.  Neck:     Thyroid: No thyroid mass, thyromegaly or thyroid tenderness.     Vascular: No carotid bruit.     Trachea: Trachea normal. No tracheal tenderness.  Cardiovascular:     Rate and Rhythm: Normal rate and regular rhythm.     Pulses: Normal pulses.          Carotid pulses are 2+ on the right side and 2+ on the left side.      Radial pulses are 2+ on the right side and 2+ on the left side.       Femoral pulses are 2+ on the right side and 2+ on the left side.      Popliteal pulses are 2+ on the right side and 2+ on the left side.       Dorsalis pedis pulses are 2+ on the right side and 2+ on the left side.       Posterior tibial pulses are 2+ on the right side and 2+ on the left side.     Heart sounds: Normal heart sounds, S1 normal and S2 normal. No murmur heard.    No friction rub. No gallop.   Pulmonary:     Effort: Pulmonary effort is normal. No respiratory distress.     Breath sounds: Normal breath sounds and air entry. No stridor. No wheezing, rhonchi or rales.  Chest:     Chest wall: No tenderness.  Abdominal:     General: Abdomen is flat. Bowel sounds are normal. There is no distension.     Palpations: Abdomen is soft. There is no mass.     Tenderness: There is no abdominal tenderness. There is no guarding or rebound.     Hernia: No hernia is present.  Genitourinary:    Comments: Exam deferred; denies complaints Musculoskeletal:        General: No swelling, tenderness, deformity or signs of injury. Normal range of motion.     Cervical back: Normal range of motion and neck supple. No rigidity or tenderness.     Right lower leg: No edema.     Left lower leg: No edema.  Lymphadenopathy:     Cervical: No cervical adenopathy.     Right cervical: No superficial, deep or posterior cervical adenopathy.    Left cervical: No superficial, deep or posterior cervical adenopathy.  Skin:    General: Skin is warm and dry.     Capillary Refill: Capillary refill takes less than 2 seconds.     Coloration: Skin is not jaundiced or pale.     Findings: No bruising, erythema, lesion or rash.  Neurological:     General: No focal deficit present.     Mental Status: He is alert and oriented to person, place, and time. Mental status is at baseline.     GCS: GCS eye subscore is 4. GCS verbal subscore  is 5. GCS motor subscore is 6.     Sensory: Sensation is intact. No sensory deficit.     Motor: Motor function is intact. No weakness.     Coordination: Coordination is intact.     Gait: Gait is intact.  Psychiatric:        Attention and Perception: Attention and perception normal.        Mood and Affect: Mood and affect normal.        Speech: Speech normal.        Behavior: Behavior normal. Behavior is cooperative.        Thought Content: Thought content normal.        Cognition and  Memory: Cognition normal.        Judgment: Judgment normal.     Depression Screen    05/05/2023    9:16 AM 07/18/2022    3:41 PM 07/30/2020    2:35 PM 07/30/2020    2:34 PM  PHQ 2/9 Scores  PHQ - 2 Score 3 0 0 0  PHQ- 9 Score 8      Results for orders placed or performed in visit on 05/05/23  CBC with Differential/Platelet  Result Value Ref Range   WBC 8.9 3.4 - 10.8 x10E3/uL   RBC 5.46 4.14 - 5.80 x10E6/uL   Hemoglobin 17.1 13.0 - 17.7 g/dL   Hematocrit 16.1 09.6 - 51.0 %   MCV 91 79 - 97 fL   MCH 31.3 26.6 - 33.0 pg   MCHC 34.6 31.5 - 35.7 g/dL   RDW 04.5 40.9 - 81.1 %   Platelets 207 150 - 450 x10E3/uL   Neutrophils 59 Not Estab. %   Lymphs 30 Not Estab. %   Monocytes 9 Not Estab. %   Eos 1 Not Estab. %   Basos 1 Not Estab. %   Neutrophils Absolute 5.3 1.4 - 7.0 x10E3/uL   Lymphocytes Absolute 2.7 0.7 - 3.1 x10E3/uL   Monocytes Absolute 0.8 0.1 - 0.9 x10E3/uL   EOS (ABSOLUTE) 0.1 0.0 - 0.4 x10E3/uL   Basophils Absolute 0.1 0.0 - 0.2 x10E3/uL   Immature Granulocytes 0 Not Estab. %   Immature Grans (Abs) 0.0 0.0 - 0.1 x10E3/uL  Comprehensive metabolic panel  Result Value Ref Range   Glucose 108 (H) 70 - 99 mg/dL   BUN 11 8 - 27 mg/dL   Creatinine, Ser 9.14 0.76 - 1.27 mg/dL   eGFR 91 >78 GN/FAO/1.30   BUN/Creatinine Ratio 12 10 - 24   Sodium 142 134 - 144 mmol/L   Potassium 4.6 3.5 - 5.2 mmol/L   Chloride 103 96 - 106 mmol/L   CO2 23 20 - 29 mmol/L   Calcium 9.3 8.6 - 10.2 mg/dL   Total Protein 7.1 6.0 - 8.5 g/dL   Albumin 4.5 3.9 - 4.9 g/dL   Globulin, Total 2.6 1.5 - 4.5 g/dL   Bilirubin Total 1.1 0.0 - 1.2 mg/dL   Alkaline Phosphatase 111 44 - 121 IU/L   AST 73 (H) 0 - 40 IU/L   ALT 71 (H) 0 - 44 IU/L  Lipid panel  Result Value Ref Range   Cholesterol, Total 169 100 - 199 mg/dL   Triglycerides 865 (H) 0 - 149 mg/dL   HDL 35 (L) >78 mg/dL   VLDL Cholesterol Cal 38 5 - 40 mg/dL   LDL Chol Calc (NIH) 96 0 - 99 mg/dL   Chol/HDL Ratio 4.8 0.0 - 5.0 ratio   PSA  Result Value Ref Range  Prostate Specific Ag, Serum 2.0 0.0 - 4.0 ng/mL  Hemoglobin A1c  Result Value Ref Range   Hgb A1c MFr Bld 5.8 (H) 4.8 - 5.6 %   Est. average glucose Bld gHb Est-mCnc 120 mg/dL    Assessment & Plan      Problem List Items Addressed This Visit       Cardiovascular and Mediastinum   ASCVD (arteriosclerotic cardiovascular disease)    Presumed given obesity, tobacco use, HTN recommend diet low in saturated fat and regular exercise - 30 min at least 5 times per week Recommend LP Encouraged to work on tobacco cessation      Relevant Orders   CBC with Differential/Platelet (Completed)   Comprehensive metabolic panel (Completed)   Lipid panel (Completed)   PSA (Completed)   Hemoglobin A1c (Completed)   Ambulatory referral to Gastroenterology   Ambulatory Referral Lung Cancer Screening [facility]   US Carotid Duplex Bilateral   Primary hypertension    Chronic, stable Recommend Cozaar 25 mg every day (previously written for 25 mg BID) Recommend labs      Relevant Orders   US Carotid Duplex Bilateral     Digestive   Polyp of ascending colon    Referral back to GI for next screening      Relevant Orders   US Carotid Duplex Bilateral     Other   Acute pain of right shoulder    Defer imaging at this time Labs stable Start pred burst, use of celebrex, muscle relaxant at bedtime and stretches F/u in 4 weeks      Relevant Medications   predniSONE (DELTASONE) 50 MG tablet   celecoxib (CELEBREX) 200 MG capsule   cyclobenzaprine (FLEXERIL) 10 MG tablet   Alcohol use disorder    Acute on chronic, imporved but continued binge drinking Continue to monitor Hx of elevated LFTs      Relevant Orders   US Carotid Duplex Bilateral   Dizziness - Primary    Acute; unknown cause Pt has held his losartan for the past 2 days; BP remains stable Recommend resume at 1/2 of previous dose or 25 mg daily Recommend carotid ultrasound and labs F/u  as needed      Relevant Orders   US Carotid Duplex Bilateral   Establishing care with new doctor, encounter for    Transfer from Dr Juel Burrow  Things to do to keep yourself healthy  - Exercise at least 30-45 minutes a day, 3-4 days a week.  - Eat a low-fat diet with lots of fruits and vegetables, up to 7-9 servings per day.  - Seatbelts can save your life. Wear them always.  - Smoke detectors on every level of your home, check batteries every year.  - Eye Doctor - have an eye exam every 1-2 years  - Safe sex - if you may be exposed to STDs, use a condom.  - Alcohol -  If you drink, do it moderately, less than 2 drinks per day.  - Health Care Power of Attorney. Choose someone to speak for you if you are not able.  - Depression is common in our stressful world.If you're feeling down or losing interest in things you normally enjoy, please come in for a visit.  - Violence - If anyone is threatening or hurting you, please call immediately.       Relevant Orders   US Carotid Duplex Bilateral   Prediabetes    Chronic; unknown Repeat A1c Continue to recommend balanced, lower carb  meals. Smaller meal size, adding snacks. Choosing water as drink of choice and increasing purposeful exercise.       Relevant Orders   Hemoglobin A1c (Completed)   US Carotid Duplex Bilateral   Tobacco dependence    Chronic, stable Remains pre contemplative at this time regarding cessation  Continue to monitor      Relevant Orders   Ambulatory Referral Lung Cancer Screening [facility]   US Carotid Duplex Bilateral   Return in about 4 weeks (around 06/02/2023) for chonic disease management- R shoulder pain.    Leilani Merl, FNP, have reviewed all documentation for this visit. The documentation on 05/06/23 for the exam, diagnosis, procedures, and orders are all accurate and complete.  Jacky Kindle, FNP  [facility] [phone] (phone) [phone] (fax)  [facility]

## 2023-05-06 ENCOUNTER — Encounter: Payer: Self-pay | Admitting: Family Medicine

## 2023-05-06 DIAGNOSIS — R7303 Prediabetes: Secondary | ICD-10-CM | POA: Insufficient documentation

## 2023-05-06 DIAGNOSIS — F1090 Alcohol use, unspecified, uncomplicated: Secondary | ICD-10-CM | POA: Insufficient documentation

## 2023-05-06 DIAGNOSIS — M25511 Pain in right shoulder: Secondary | ICD-10-CM | POA: Insufficient documentation

## 2023-05-06 DIAGNOSIS — I2510 Atherosclerotic heart disease of native coronary artery without angina pectoris: Secondary | ICD-10-CM | POA: Insufficient documentation

## 2023-05-06 DIAGNOSIS — I10 Essential (primary) hypertension: Secondary | ICD-10-CM | POA: Insufficient documentation

## 2023-05-06 DIAGNOSIS — R42 Dizziness and giddiness: Secondary | ICD-10-CM | POA: Insufficient documentation

## 2023-05-06 DIAGNOSIS — F17200 Nicotine dependence, unspecified, uncomplicated: Secondary | ICD-10-CM | POA: Insufficient documentation

## 2023-05-06 DIAGNOSIS — Z7689 Persons encountering health services in other specified circumstances: Secondary | ICD-10-CM | POA: Insufficient documentation

## 2023-05-06 MED ORDER — PREDNISONE 50 MG PO TABS: 50.0000 mg | ORAL_TABLET | Freq: Every day | ORAL | 0 refills | Status: DC

## 2023-05-06 MED ORDER — CYCLOBENZAPRINE HCL 10 MG PO TABS: 10.0000 mg | ORAL_TABLET | Freq: Every day | ORAL | 0 refills | Status: DC

## 2023-05-06 MED ORDER — CELECOXIB 200 MG PO CAPS: 200.0000 mg | ORAL_CAPSULE | Freq: Two times a day (BID) | ORAL | 0 refills | Status: DC

## 2023-05-06 NOTE — Assessment & Plan Note (Signed)
Chronic, stable Recommend Cozaar 25 mg every day (previously written for 25 mg BID) Recommend labs

## 2023-05-06 NOTE — Patient Instructions (Addendum)
Referrals placed for -colon cancer screening -lung cancer screening -ultrasound for neck  Start -pred burst for inflammation -celebrex in place of pain powders -flexeril at night -use exercises as able  The CDC recommends two doses of Shingrix (the new shingles vaccine) separated by 2 to 6 months for adults age 63 years and older. I recommend checking with your insurance plan regarding coverage for this vaccine.

## 2023-05-06 NOTE — Assessment & Plan Note (Signed)
Presumed given obesity, tobacco use, HTN recommend diet low in saturated fat and regular exercise - 30 min at least 5 times per week Recommend LP Encouraged to work on tobacco cessation

## 2023-05-06 NOTE — Assessment & Plan Note (Signed)
Referral back to GI for next screening

## 2023-05-06 NOTE — Assessment & Plan Note (Signed)
Acute; unknown cause Pt has held his losartan for the past 2 days; BP remains stable Recommend resume at 1/2 of previous dose or 25 mg daily Recommend carotid ultrasound and labs F/u as needed

## 2023-05-06 NOTE — Assessment & Plan Note (Signed)
Acute on chronic, imporved but continued binge drinking Continue to monitor Hx of elevated LFTs

## 2023-05-06 NOTE — Assessment & Plan Note (Signed)
Chronic, stable Remains pre contemplative at this time regarding cessation  Continue to monitor

## 2023-05-06 NOTE — Assessment & Plan Note (Signed)
Chronic; unknown Repeat A1c Continue to recommend balanced, lower carb meals. Smaller meal size, adding snacks. Choosing water as drink of choice and increasing purposeful exercise.

## 2023-05-06 NOTE — Assessment & Plan Note (Signed)
Defer imaging at this time Labs stable Start pred burst, use of celebrex, muscle relaxant at bedtime and stretches F/u in 4 weeks

## 2023-05-06 NOTE — Assessment & Plan Note (Signed)
Transfer from Dr Juel Burrow  Things to do to keep yourself healthy  - Exercise at least 30-45 minutes a day, 3-4 days a week.  - Eat a low-fat diet with lots of fruits and vegetables, up to 7-9 servings per day.  - Seatbelts can save your life. Wear them always.  - Smoke detectors on every level of your home, check batteries every year.  - Eye Doctor - have an eye exam every 1-2 years  - Safe sex - if you may be exposed to STDs, use a condom.  - Alcohol -  If you drink, do it moderately, less than 2 drinks per day.  - Health Care Power of Attorney. Choose someone to speak for you if you are not able.  - Depression is common in our stressful world.If you're feeling down or losing interest in things you normally enjoy, please come in for a visit.  - Violence - If anyone is threatening or hurting you, please call immediately.

## 2023-05-15 ENCOUNTER — Encounter: Payer: Self-pay | Admitting: *Deleted

## 2023-05-19 ENCOUNTER — Ambulatory Visit
Admission: RE | Admit: 2023-05-19 | Discharge: 2023-05-19 | Disposition: A | Discharge status: Home / Self Care | Payer: Managed Care, Other (non HMO) | Source: Ambulatory Visit | Attending: Family Medicine | Admitting: Family Medicine

## 2023-05-19 DIAGNOSIS — R42 Dizziness and giddiness: Secondary | ICD-10-CM | POA: Insufficient documentation

## 2023-05-19 DIAGNOSIS — R7303 Prediabetes: Secondary | ICD-10-CM | POA: Insufficient documentation

## 2023-05-19 DIAGNOSIS — F17200 Nicotine dependence, unspecified, uncomplicated: Secondary | ICD-10-CM | POA: Diagnosis present

## 2023-05-19 DIAGNOSIS — I10 Essential (primary) hypertension: Secondary | ICD-10-CM | POA: Diagnosis present

## 2023-05-19 DIAGNOSIS — F1090 Alcohol use, unspecified, uncomplicated: Secondary | ICD-10-CM | POA: Insufficient documentation

## 2023-05-19 DIAGNOSIS — K635 Polyp of colon: Secondary | ICD-10-CM | POA: Diagnosis present

## 2023-05-19 DIAGNOSIS — Z7689 Persons encountering health services in other specified circumstances: Secondary | ICD-10-CM | POA: Diagnosis present

## 2023-05-19 DIAGNOSIS — I2510 Atherosclerotic heart disease of native coronary artery without angina pectoris: Secondary | ICD-10-CM | POA: Diagnosis not present

## 2023-05-22 ENCOUNTER — Other Ambulatory Visit: Payer: Self-pay | Admitting: Family Medicine

## 2023-05-22 MED ORDER — ROSUVASTATIN CALCIUM 40 MG PO TABS: 40.0000 mg | ORAL_TABLET | Freq: Every day | ORAL | 3 refills | Status: DC

## 2023-06-02 ENCOUNTER — Encounter: Payer: Self-pay | Admitting: Family Medicine

## 2023-06-02 ENCOUNTER — Ambulatory Visit: Payer: Managed Care, Other (non HMO) | Admitting: Family Medicine

## 2023-06-02 VITALS — BP 127/86 | HR 85 | Ht 66.0 in | Wt 205.9 lb

## 2023-06-02 DIAGNOSIS — F17200 Nicotine dependence, unspecified, uncomplicated: Secondary | ICD-10-CM

## 2023-06-02 DIAGNOSIS — I10 Essential (primary) hypertension: Secondary | ICD-10-CM | POA: Diagnosis not present

## 2023-06-02 DIAGNOSIS — M25511 Pain in right shoulder: Secondary | ICD-10-CM

## 2023-06-02 DIAGNOSIS — R7303 Prediabetes: Secondary | ICD-10-CM

## 2023-06-02 DIAGNOSIS — I2510 Atherosclerotic heart disease of native coronary artery without angina pectoris: Secondary | ICD-10-CM | POA: Diagnosis not present

## 2023-06-02 DIAGNOSIS — F1090 Alcohol use, unspecified, uncomplicated: Secondary | ICD-10-CM

## 2023-06-02 MED ORDER — CYCLOBENZAPRINE HCL 10 MG PO TABS
10.0000 mg | ORAL_TABLET | Freq: Every day | ORAL | 0 refills | Status: DC
Start: 2023-06-02 — End: 2023-10-06

## 2023-06-02 MED ORDER — ROSUVASTATIN CALCIUM 40 MG PO TABS: 40.0000 mg | ORAL_TABLET | Freq: Every day | ORAL | 3 refills | Status: DC

## 2023-06-02 MED ORDER — CELECOXIB 200 MG PO CAPS: 200.0000 mg | ORAL_CAPSULE | Freq: Two times a day (BID) | ORAL | 0 refills | Status: DC

## 2023-06-02 MED ORDER — PREDNISONE 50 MG PO TABS: 50.0000 mg | ORAL_TABLET | Freq: Every day | ORAL | 0 refills | Status: DC

## 2023-06-02 MED ORDER — LOSARTAN POTASSIUM 100 MG PO TABS: 100.0000 mg | ORAL_TABLET | Freq: Every day | ORAL | 3 refills | Status: DC

## 2023-06-02 NOTE — Assessment & Plan Note (Signed)
Chronic, stable Elevated LFTs; continue to recommend moderation with goal of 2 drinks/day max

## 2023-06-02 NOTE — Assessment & Plan Note (Signed)
Chronic, elevated today; previous BP stable Pt notes R shoulder pain d/t poor adherence to conservative POC Recommend use of Losartan 100 mg daily given previous Rx for 50 mg BID; however, pt declines d/t concerns for dizziness. Will continue to check BP at home and discussed goal of 119/79 given known ASCVD 3 month follow up from initial appt recommended

## 2023-06-02 NOTE — Assessment & Plan Note (Signed)
Chronic; remains on crestor 40 mg Continue to recommend lifestyle modification and diet/exercise LDL goal 50-70

## 2023-06-02 NOTE — Progress Notes (Signed)
Established patient visit   Patient: Larry Moses   DOB: 1961/09/12   62 y.o. Male  MRN: [id number] Visit Date: 06/02/2023  Today's healthcare provider: Jacky Kindle, FNP  Introduced to nurse practitioner role and practice setting.  All questions answered.  Discussed provider/patient relationship and expectations.  Subjective    HPI HPI     Medical Management of Chronic Issues    Additional comments: F/u on right shoulder, still in pain, trouble sleeping and getting comfy, can't raise but too far. Discuss ultrasound of neck, would like to discuss celebrex      Last edited by Rolly Salter, CMA on 06/02/2023  4:07 PM.      The 10-year ASCVD risk score (Arnett DK, et al., 2019) is: 19.8%  Medications: Outpatient Medications Prior to Visit  Medication Sig   [DISCONTINUED] losartan (COZAAR) 25 MG tablet Take 1 tablet (25 mg total) by mouth daily.   [DISCONTINUED] rosuvastatin (CRESTOR) 40 MG tablet Take 1 tablet (40 mg total) by mouth daily.   [DISCONTINUED] celecoxib (CELEBREX) 200 MG capsule Take 1 capsule (200 mg total) by mouth 2 (two) times daily. (Patient not taking: Reported on 06/02/2023)   [DISCONTINUED] cyclobenzaprine (FLEXERIL) 10 MG tablet Take 1 tablet (10 mg total) by mouth at bedtime. (Patient not taking: Reported on 06/02/2023)   [DISCONTINUED] predniSONE (DELTASONE) 50 MG tablet Take 1 tablet (50 mg total) by mouth daily with breakfast. (Patient not taking: Reported on 06/02/2023)   No facility-administered medications prior to visit.     Objective    BP 127/86 Comment: prior appt  Pulse 85   Ht 5\' 6"  (1.676 m)   Wt 205 lb 14.4 oz (93.4 kg)   SpO2 97%   BMI 33.23 kg/m   Physical Exam Vitals and nursing note reviewed.  Constitutional:      Appearance: Normal appearance. He is obese.  HENT:     Head: Normocephalic and atraumatic.  Cardiovascular:     Rate and Rhythm: Normal rate and regular rhythm.     Pulses: Normal pulses.     Heart sounds:  Normal heart sounds.  Pulmonary:     Effort: Pulmonary effort is normal.     Breath sounds: Normal breath sounds.  Musculoskeletal:        General: Tenderness present. Normal range of motion.     Cervical back: Normal range of motion.     Comments: Poor ROM iso R shoulder tenderness and pain s/s impingement syndrome   Skin:    General: Skin is warm and dry.     Capillary Refill: Capillary refill takes less than 2 seconds.  Neurological:     General: No focal deficit present.     Mental Status: He is alert and oriented to person, place, and time. Mental status is at baseline.  Psychiatric:        Mood and Affect: Mood normal.        Behavior: Behavior normal.        Thought Content: Thought content normal.        Judgment: Judgment normal.     No results found for any visits on 06/02/23.  Assessment & Plan     Problem List Items Addressed This Visit       Cardiovascular and Mediastinum   ASCVD (arteriosclerotic cardiovascular disease)    Chronic; remains on crestor 40 mg Continue to recommend lifestyle modification and diet/exercise LDL goal 50-70       Relevant Medications  rosuvastatin (CRESTOR) 40 MG tablet   Primary hypertension    Chronic, elevated today; previous BP stable Pt notes R shoulder pain d/t poor adherence to conservative POC Recommend use of Losartan 100 mg daily given previous Rx for 50 mg BID; however, pt declines d/t concerns for dizziness. Will continue to check BP at home and discussed goal of 119/79 given known ASCVD 3 month follow up from initial appt recommended       Relevant Medications   rosuvastatin (CRESTOR) 40 MG tablet     Other   Acute pain of right shoulder - Primary    Start pred burst, use of celebrex, muscle relaxant at bedtime and stretches Pt reports poor understanding of treatment plan and did not start; referral to sports med if needed for additional care Defer steroid injection at this time       Relevant Medications    celecoxib (CELEBREX) 200 MG capsule   cyclobenzaprine (FLEXERIL) 10 MG tablet   predniSONE (DELTASONE) 50 MG tablet (Start on 06/03/2023)   Other Relevant Orders   Ambulatory referral to Sports Medicine   Alcohol use disorder    Chronic, stable Elevated LFTs; continue to recommend moderation with goal of 2 drinks/day max      Prediabetes    Chronic, stable Continue to recommend balanced, lower carb meals. Smaller meal size, adding snacks. Choosing water as drink of choice and increasing purposeful exercise. Recommend 3-6 mont f/u for pre-diabetes       Tobacco dependence    Chronic, stable Discussed ASCVD as number one side effect of continued tobacco use and results of carotid stenosis Pt remains pre contemplative at this time regarding cessation efforts       Return in about 2 months (around 08/07/2023) for chonic disease management.     Leilani Merl, FNP, have reviewed all documentation for this visit. The documentation on 06/02/23 for the exam, diagnosis, procedures, and orders are all accurate and complete.  Jacky Kindle, FNP  [facility] [phone] (phone) [phone] (fax)  [facility]

## 2023-06-02 NOTE — Assessment & Plan Note (Signed)
Chronic, stable Discussed ASCVD as number one side effect of continued tobacco use and results of carotid stenosis Pt remains pre contemplative at this time regarding cessation efforts

## 2023-06-02 NOTE — Assessment & Plan Note (Signed)
Start pred burst, use of celebrex, muscle relaxant at bedtime and stretches Pt reports poor understanding of treatment plan and did not start; referral to sports med if needed for additional care Defer steroid injection at this time

## 2023-06-02 NOTE — Assessment & Plan Note (Signed)
Chronic, stable Continue to recommend balanced, lower carb meals. Smaller meal size, adding snacks. Choosing water as drink of choice and increasing purposeful exercise. Recommend 3-6 mont f/u for pre-diabetes

## 2023-06-15 ENCOUNTER — Encounter: Payer: Self-pay | Admitting: *Deleted

## 2023-07-01 ENCOUNTER — Other Ambulatory Visit: Payer: Self-pay | Admitting: Family Medicine

## 2023-07-01 DIAGNOSIS — M25511 Pain in right shoulder: Secondary | ICD-10-CM

## 2023-07-03 NOTE — Telephone Encounter (Signed)
Requested Prescriptions  Pending Prescriptions Disp Refills   celecoxib (CELEBREX) 200 MG capsule [Pharmacy Med Name: CELECOXIB 200MG  CAPSULES] 180 capsule 1    Sig: TAKE 1 CAPSULE(200 MG) BY MOUTH TWICE DAILY     Analgesics:  COX2 Inhibitors Failed - 07/01/2023  3:39 AM      Failed - Manual Review: Labs are only required if the patient has taken medication for more than 8 weeks.      Failed - AST in normal range and within 360 days    AST  Date Value Ref Range Status  05/05/2023 73 (H) 0 - 40 IU/L Final         Failed - ALT in normal range and within 360 days    ALT  Date Value Ref Range Status  05/05/2023 71 (H) 0 - 44 IU/L Final         Passed - HGB in normal range and within 360 days    Hemoglobin  Date Value Ref Range Status  05/05/2023 17.1 13.0 - 17.7 g/dL Final         Passed - Cr in normal range and within 360 days    Creat  Date Value Ref Range Status  12/26/2022 0.65 (L) 0.70 - 1.35 mg/dL Final   Creatinine, Ser  Date Value Ref Range Status  05/05/2023 0.94 0.76 - 1.27 mg/dL Final         Passed - HCT in normal range and within 360 days    Hematocrit  Date Value Ref Range Status  05/05/2023 49.4 37.5 - 51.0 % Final         Passed - eGFR is 30 or above and within 360 days    GFR, Est African American  Date Value Ref Range Status  07/28/2020 118 > OR = 60 mL/min/1.58m2 Final   GFR, Est Non African American  Date Value Ref Range Status  07/28/2020 102 > OR = 60 mL/min/1.69m2 Final   eGFR  Date Value Ref Range Status  05/05/2023 91 >59 mL/min/1.73 Final         Passed - Patient is not pregnant      Passed - Valid encounter within last 12 months    Recent Outpatient Visits           1 month ago Acute pain of right shoulder   [facility] Jacky Kindle, FNP   1 month ago Dizziness   [facility] Jacky Kindle, FNP       Future Appointments             In 5 months Jacky Kindle, FNP [facility]  [facility]

## 2023-07-11 ENCOUNTER — Other Ambulatory Visit: Payer: Self-pay

## 2023-07-11 DIAGNOSIS — F17210 Nicotine dependence, cigarettes, uncomplicated: Secondary | ICD-10-CM

## 2023-07-11 DIAGNOSIS — Z87891 Personal history of nicotine dependence: Secondary | ICD-10-CM

## 2023-07-11 DIAGNOSIS — Z122 Encounter for screening for malignant neoplasm of respiratory organs: Secondary | ICD-10-CM

## 2023-08-02 ENCOUNTER — Inpatient Hospital Stay: Admission: RE | Admit: 2023-08-02 | Payer: Managed Care, Other (non HMO) | Source: Ambulatory Visit

## 2023-08-04 ENCOUNTER — Encounter: Payer: Self-pay | Admitting: Emergency Medicine

## 2023-08-24 ENCOUNTER — Ambulatory Visit: Payer: Self-pay | Admitting: *Deleted

## 2023-08-24 ENCOUNTER — Telehealth: Payer: Self-pay | Admitting: Family Medicine

## 2023-08-24 NOTE — Telephone Encounter (Signed)
Message from Welcome E sent at 08/24/2023  2:35 PM EST  Summary: Symptoms, following taking medication   Pt called reporting that he has side effects after taking rosuvastatin. Says his head and the back of his head/neck hurts after taking the medication.          Call History  Contact Date/Time Type Contact Phone/Fax User  08/24/2023 02:34 PM EST Phone (Incoming) Larry Moses, Larry Moses (Self) [phone] Judie Petit) [name]   Reason for Disposition  [1] Caller has URGENT medicine question about med that PCP or specialist prescribed AND [2] triager unable to answer question  Answer Assessment - Initial Assessment Questions 1. NAME of MEDICINE: "What medicine(s) are you calling about?"     I returned his call.    I've been taking the rosuvastatin for a couple of months.   My neck and head hurts.   My neck gets stiff and I get a headache.   If I stop it I don't have the headaches.    I was having headaches before starting this but they get pretty bad.    I'm asking about my BP medicine too.   I need a refill.   The pharmacy told me that the provider cancelled my losartan.   I only have 3 pills left.   2. QUESTION: "What is your question?" (e.g., double dose of medicine, side effect)     Is it the rosuvastatin causing the headache and neck pain?  Is that a side effect?    I don't want to come up there and pay $30 when she could just tell me over the phone.    3. PRESCRIBER: "Who prescribed the medicine?" Reason: if prescribed by specialist, call should be referred to that group.     Merita Norton, FNP   4. SYMPTOMS: "Do you have any symptoms?" If Yes, ask: "What symptoms are you having?"  "How bad are the symptoms (e.g., mild, moderate, severe)     Headache and neck pain. Asking about his refill for the losartan.   Pharmacy said the provider cancelled it. 5. PREGNANCY:  "Is there any chance that you are pregnant?" "When was your last menstrual period?"     N/A  Protocols used:  Medication Question Call-A-AH

## 2023-08-24 NOTE — Telephone Encounter (Signed)
Patient triaged today and had questions about both of these medications being requested, encounter routed to practice for the provider to review and approve, this is a duplicate request. [facility] called and spoke to Casimiro Needle, Pensions consultant about the refill(s) rosuvastatin requested. Advised it was sent on 06/02/23 #90/3 refill(s). He says they have it and will be ready for the patient to pick up on Saturday after 1100.

## 2023-08-24 NOTE — Telephone Encounter (Signed)
  Chief Complaint: He is wondering if the rosuvastatin is causing him to have pain in his head and neck?   "I don't need an appt".   "She can call me and tell me that without having to come in and pay $30 for something she can tell me over the phone".   I let him know it may not be the rosuvastatin but he is refusing an appt.   Then he mentioned needing a refill on his BP medicine losartan.     I don't see losartan on his med list.   His wife got on the phone and said he only has 3 pills left of this.     I see a refill request for the rosuvastatin but not losartan. Symptoms: Headache and neck pain he thinks it happens after taking the rosuvastatin.   Then mentioned he was getting headaches before starting the rosuvastatin a couple of months ago.   He refused an appt. Frequency: Headaches prior to starting rosuvastatin but thinks they are worse now.   When he stops the rosuvastatin the headaches go away.     Pertinent Negatives: Patient denies knowing for sure if it is the rosuvastatin. Disposition: [] ED /[] Urgent Care (no appt availability in office) / [] Appointment(In office/virtual)/ []  [facility]/ [] Home Care/ [] Refused Recommended Disposition /[] [facility]/ [x]  Follow-up with PCP Additional Notes:  Message sent to provider at his request regarding the rosuvastatin and headaches and needing a refill on losartan that is not on his med list.  Refuses an appt.

## 2023-08-24 NOTE — Telephone Encounter (Signed)
Medication Refill -  Most Recent Primary Care Visit:  Provider: Merita Norton T  Department: [facility]  Visit Type: OFFICE VISIT  Date: 06/02/2023  Medication: rosuvastatin (CRESTOR) 40 MG tablet  Losartan 100 MG Tablet   Has the patient contacted their pharmacy? Yes (Agent: If no, request that the patient contact the pharmacy for the refill. If patient does not wish to contact the pharmacy document the reason why and proceed with request.) (Agent: If yes, when and what did the pharmacy advise?)  Is this the correct pharmacy for this prescription? Yes  [id number] [place], [place] - [address] AT [facility] OF [address] [place] [address] Phone: [phone] Fax: [phone]   Has the prescription been filled recently? Yes  Is the patient out of the medication? Yes  Has the patient been seen for an appointment in the last year OR does the patient have an upcoming appointment? Yes  Can we respond through MyChart? Yes  Agent: Please be advised that Rx refills may take up to 3 business days. We ask that you follow-up with your pharmacy.

## 2023-08-25 ENCOUNTER — Encounter: Payer: Self-pay | Admitting: Family Medicine

## 2023-08-25 ENCOUNTER — Other Ambulatory Visit: Payer: Self-pay | Admitting: Family Medicine

## 2023-08-25 ENCOUNTER — Telehealth: Payer: Self-pay | Admitting: Family Medicine

## 2023-08-25 MED ORDER — LOSARTAN POTASSIUM 100 MG PO TABS: 100.0000 mg | ORAL_TABLET | Freq: Every day | ORAL | 3 refills | Status: DC

## 2023-08-25 NOTE — Telephone Encounter (Signed)
Medication Refill -  Most Recent Primary Care Visit:  Provider: Merita Norton T  Department: [facility]  Visit Type: OFFICE VISIT  Date: 06/02/2023  Medication: Losartan 50mg    Has the patient contacted their pharmacy? No  Is this the correct pharmacy for this prescription? Yes  This is the patient's preferred pharmacy:  [facility][id number] [place], [place] - [address] AT [facility] OF [address] [place] [address] Phone: [phone] Fax: [phone]   Has the prescription been filled recently? Yes  Is the patient out of the medication? Yes  Has the patient been seen for an appointment in the last year OR does the patient have an upcoming appointment? Yes  Can we respond through MyChart? No  Agent: Please be advised that Rx refills may take up to 3 business days. We ask that you follow-up with your pharmacy.  Medication not found in current med list

## 2023-08-25 NOTE — Telephone Encounter (Signed)
Pt is calling to report that he takes 50mg  losartan not 100mg . Please advise.

## 2023-08-25 NOTE — Telephone Encounter (Signed)
Patient called again waiting on provider to call him back. He spoke with nurse yesterday in reference to the neck stiffness and headaches he get when he take rosuvastatin (CRESTOR). Please have provider f/u with patient

## 2023-08-25 NOTE — Telephone Encounter (Signed)
This encounter was created in error - please disregard.

## 2023-08-25 NOTE — Telephone Encounter (Addendum)
Pt called and stated that he is stopping the Crestor. Pt stated when he takes it he get s headache that nothing will help it. Pt stated he took it last night and has a headache. Pt stated that he has stopped it before and no headache but as soon as he restarted it he gets a headache. Pt also wanted to clarify the 100 mg Losartan. Advised pt that at his OV on 06/02/23 he was to start Losartan 100 mg. Pt stated he had been taking 75 mg. Do not see that note nor in med hx. Pt had not read theMyChart Message PCP sent.

## 2023-08-29 NOTE — Telephone Encounter (Signed)
Spoke to pt--stated stop the simvastatin over the weekend, next day headache is gone. Pt scheduled appt in Dec.for f/u.

## 2023-08-29 NOTE — Telephone Encounter (Signed)
Pt verified --pick-up  losartan 100 mg at the pharmacy. Pt stated--Simvastatin causing headache. Please advise

## 2023-09-15 ENCOUNTER — Other Ambulatory Visit: Payer: Self-pay | Admitting: Family Medicine

## 2023-09-15 ENCOUNTER — Ambulatory Visit: Payer: Managed Care, Other (non HMO) | Admitting: Family Medicine

## 2023-09-15 VITALS — BP 166/105 | HR 74 | Temp 97.9°F | Resp 16 | Ht 66.0 in | Wt 204.6 lb

## 2023-09-15 DIAGNOSIS — Z114 Encounter for screening for human immunodeficiency virus [HIV]: Secondary | ICD-10-CM

## 2023-09-15 DIAGNOSIS — M25511 Pain in right shoulder: Secondary | ICD-10-CM

## 2023-09-15 DIAGNOSIS — F17200 Nicotine dependence, unspecified, uncomplicated: Secondary | ICD-10-CM

## 2023-09-15 DIAGNOSIS — I160 Hypertensive urgency: Secondary | ICD-10-CM

## 2023-09-15 DIAGNOSIS — Z1159 Encounter for screening for other viral diseases: Secondary | ICD-10-CM

## 2023-09-15 DIAGNOSIS — I2510 Atherosclerotic heart disease of native coronary artery without angina pectoris: Secondary | ICD-10-CM | POA: Diagnosis not present

## 2023-09-15 DIAGNOSIS — F1090 Alcohol use, unspecified, uncomplicated: Secondary | ICD-10-CM | POA: Diagnosis not present

## 2023-09-15 MED ORDER — CLONIDINE HCL 0.1 MG PO TABS: 0.1000 mg | ORAL_TABLET | Freq: Three times a day (TID) | ORAL | 0 refills | Status: DC | PRN

## 2023-09-15 MED ORDER — AMLODIPINE BESYLATE 5 MG PO TABS: 10.0000 mg | ORAL_TABLET | Freq: Every day | ORAL | 0 refills | Status: DC

## 2023-09-15 NOTE — Patient Instructions (Signed)
Continue Losartan at 100 mg daily- max dose  Start norvasc at 5 mg daily x 1 weeks; increase to 10 mg daily following, taken daily. Starting at 10 initially can cause lower leg swelling  PRN clonidine available up to 3 times per day; use as needed for SBP >150 and DBP >95  Hold crestor/cholesterol medication until follow up

## 2023-09-15 NOTE — Telephone Encounter (Signed)
Both meds reordered today 09/15/23 1 month worth   Requested Prescriptions  Refused Prescriptions Disp Refills   cloNIDine (CATAPRES) 0.1 MG tablet [Pharmacy Med Name: CLONIDINE 0.1MG  TABLETS] 270 tablet     Sig: TAKE 1 TABLET(0.1 MG) BY MOUTH THREE TIMES DAILY AS NEEDED     Cardiovascular:  Alpha-2 Agonists Failed - 09/15/2023  3:09 PM      Failed - Last BP in normal range    BP Readings from Last 1 Encounters:  09/15/23 (!) 166/105         Passed - Last Heart Rate in normal range    Pulse Readings from Last 1 Encounters:  09/15/23 74         Passed - Valid encounter within last 6 months    Recent Outpatient Visits           Today Hypertensive urgency   [facility] Merita Norton T, FNP   3 months ago Acute pain of right shoulder   [facility] Jacky Kindle, FNP   4 months ago Dizziness   [facility] Jacky Kindle, FNP       Future Appointments             In 3 weeks Jacky Kindle, FNP [facility]             amLODipine (NORVASC) 5 MG tablet [Pharmacy Med Name: AMLODIPINE BESYLATE 5MG  TABLETS] 180 tablet     Sig: TAKE 2 TABLETS(10 MG) BY MOUTH DAILY     Cardiovascular: Calcium Channel Blockers 2 Failed - 09/15/2023  3:09 PM      Failed - Last BP in normal range    BP Readings from Last 1 Encounters:  09/15/23 (!) 166/105         Passed - Last Heart Rate in normal range    Pulse Readings from Last 1 Encounters:  09/15/23 74         Passed - Valid encounter within last 6 months    Recent Outpatient Visits           Today Hypertensive urgency   [facility] Merita Norton T, FNP   3 months ago Acute pain of right shoulder   [facility] Jacky Kindle, FNP   4 months ago Dizziness   [facility] Jacky Kindle, FNP       Future Appointments             In 3  weeks Jacky Kindle, FNP [facility]

## 2023-09-15 NOTE — Progress Notes (Unsigned)
Established patient visit   Patient: Larry Moses   DOB: [birth year]   62 y.o. Male  MRN: [id number] Visit Date: 09/15/2023  Today's healthcare provider: Jacky Kindle, FNP  Introduced to nurse practitioner role and practice setting.  All questions answered.  Discussed provider/patient relationship and expectations.  Chief Complaint  Patient presents with   Medical Management of Chronic Issues    4 month follow-up   Hypertension   Hyperlipidemia   Follow-up    Stopped taken rouvastatin calium 2 wks ago causing headaches and but still taken losartan potassium and blood pressure has been high feeling pressure in top of head  Yesterday it as up to 182 it came down a little bit this morning to around 150    Subjective    Hypertension  Hyperlipidemia   HPI     Medical Management of Chronic Issues    Additional comments: 4 month follow-up        Follow-up    Additional comments: Stopped taken rouvastatin calium 2 wks ago causing headaches and but still taken losartan potassium and blood pressure has been high feeling pressure in top of head  Yesterday it as up to 182 it came down a little bit this morning to around 150       Last edited by Abe People L on 09/15/2023  1:28 PM.      Medications: Outpatient Medications Prior to Visit  Medication Sig Note   losartan (COZAAR) 100 MG tablet Take 1 tablet (100 mg total) by mouth daily.    celecoxib (CELEBREX) 200 MG capsule TAKE 1 CAPSULE(200 MG) BY MOUTH TWICE DAILY (Patient not taking: Reported on 09/15/2023)    cyclobenzaprine (FLEXERIL) 10 MG tablet Take 1 tablet (10 mg total) by mouth at bedtime.    predniSONE (DELTASONE) 50 MG tablet Take 1 tablet (50 mg total) by mouth daily with breakfast. (Patient not taking: Reported on 09/15/2023)    rosuvastatin (CRESTOR) 40 MG tablet Take 1 tablet (40 mg total) by mouth daily. (Patient not taking: Reported on 09/15/2023) 09/15/2023: Causing headaches   No  facility-administered medications prior to visit.   Last CBC Lab Results  Component Value Date   WBC 7.5 09/15/2023   HGB 15.3 09/15/2023   HCT 44.3 09/15/2023   MCV 93 09/15/2023   MCH 32.2 09/15/2023   RDW 11.7 09/15/2023   PLT 175 09/15/2023   Last metabolic panel Lab Results  Component Value Date   GLUCOSE 82 09/15/2023   NA 145 (H) 09/15/2023   K 4.7 09/15/2023   CL 102 09/15/2023   CO2 26 09/15/2023   BUN 15 09/15/2023   CREATININE 0.85 09/15/2023   EGFR 98 09/15/2023   CALCIUM 8.4 (L) 09/15/2023   PROT 6.6 09/15/2023   ALBUMIN 4.2 09/15/2023   LABGLOB 2.4 09/15/2023   BILITOT 0.6 09/15/2023   ALKPHOS 113 09/15/2023   AST 47 (H) 09/15/2023   ALT 47 (H) 09/15/2023   Last lipids Lab Results  Component Value Date   CHOL 166 09/15/2023   HDL 41 09/15/2023   LDLCALC 84 09/15/2023   TRIG 250 (H) 09/15/2023   CHOLHDL 4.0 09/15/2023   Last hemoglobin A1c Lab Results  Component Value Date   HGBA1C 5.8 (H) 09/15/2023   Last thyroid functions Lab Results  Component Value Date   TSH 3.080 09/15/2023     Objective    BP (!) 166/105 (BP Location: Left Arm, Patient Position: Sitting, Cuff Size: Large)  Pulse 74   Temp 97.9 F (36.6 C)   Resp 16   Ht 5\' 6"  (1.676 m)   Wt 204 lb 9.6 oz (92.8 kg)   SpO2 96%   BMI 33.02 kg/m   BP Readings from Last 3 Encounters:  09/15/23 (!) 166/105  06/02/23 127/86  05/05/23 127/86   Wt Readings from Last 3 Encounters:  09/15/23 204 lb 9.6 oz (92.8 kg)  06/02/23 205 lb 14.4 oz (93.4 kg)  05/05/23 201 lb 9.6 oz (91.4 kg)   SpO2 Readings from Last 3 Encounters:  09/15/23 96%  06/02/23 97%  05/05/23 97%   Physical Exam Vitals and nursing note reviewed.  Constitutional:      General: He is not in acute distress.    Appearance: Normal appearance. He is obese. He is not ill-appearing, toxic-appearing or diaphoretic.  HENT:     Head: Normocephalic and atraumatic.  Neck:     Vascular: No carotid bruit.   Cardiovascular:     Rate and Rhythm: Normal rate and regular rhythm.     Pulses: Normal pulses.     Heart sounds: Normal heart sounds.  Pulmonary:     Effort: Pulmonary effort is normal.     Breath sounds: Normal breath sounds.  Musculoskeletal:        General: Normal range of motion.     Cervical back: Normal range of motion.  Skin:    General: Skin is warm and dry.     Capillary Refill: Capillary refill takes less than 2 seconds.  Neurological:     General: No focal deficit present.     Mental Status: He is alert and oriented to person, place, and time. Mental status is at baseline.  Psychiatric:        Mood and Affect: Mood normal.        Behavior: Behavior normal.        Thought Content: Thought content normal.        Judgment: Judgment normal.     Results for orders placed or performed in visit on 09/15/23  CBC with Differential/Platelet  Result Value Ref Range   WBC 7.5 3.4 - 10.8 x10E3/uL   RBC 4.75 4.14 - 5.80 x10E6/uL   Hemoglobin 15.3 13.0 - 17.7 g/dL   Hematocrit 65.7 84.6 - 51.0 %   MCV 93 79 - 97 fL   MCH 32.2 26.6 - 33.0 pg   MCHC 34.5 31.5 - 35.7 g/dL   RDW 96.2 95.2 - 84.1 %   Platelets 175 150 - 450 x10E3/uL   Neutrophils 51 Not Estab. %   Lymphs 36 Not Estab. %   Monocytes 10 Not Estab. %   Eos 2 Not Estab. %   Basos 1 Not Estab. %   Neutrophils Absolute 3.9 1.4 - 7.0 x10E3/uL   Lymphocytes Absolute 2.7 0.7 - 3.1 x10E3/uL   Monocytes Absolute 0.7 0.1 - 0.9 x10E3/uL   EOS (ABSOLUTE) 0.1 0.0 - 0.4 x10E3/uL   Basophils Absolute 0.0 0.0 - 0.2 x10E3/uL   Immature Granulocytes 0 Not Estab. %   Immature Grans (Abs) 0.0 0.0 - 0.1 x10E3/uL  Comprehensive Metabolic Panel (CMET)  Result Value Ref Range   Glucose 82 70 - 99 mg/dL   BUN 15 8 - 27 mg/dL   Creatinine, Ser 3.24 0.76 - 1.27 mg/dL   eGFR 98 >40 NU/UVO/5.36   BUN/Creatinine Ratio 18 10 - 24   Sodium 145 (H) 134 - 144 mmol/L   Potassium 4.7 3.5 - 5.2 mmol/L  Chloride 102 96 - 106 mmol/L   CO2  26 20 - 29 mmol/L   Calcium 8.4 (L) 8.6 - 10.2 mg/dL   Total Protein 6.6 6.0 - 8.5 g/dL   Albumin 4.2 3.9 - 4.9 g/dL   Globulin, Total 2.4 1.5 - 4.5 g/dL   Bilirubin Total 0.6 0.0 - 1.2 mg/dL   Alkaline Phosphatase 113 44 - 121 IU/L   AST 47 (H) 0 - 40 IU/L   ALT 47 (H) 0 - 44 IU/L  TSH  Result Value Ref Range   TSH 3.080 0.450 - 4.500 uIU/mL  Hemoglobin A1c  Result Value Ref Range   Hgb A1c MFr Bld 5.8 (H) 4.8 - 5.6 %   Est. average glucose Bld gHb Est-mCnc 120 mg/dL  Lipid panel  Result Value Ref Range   Cholesterol, Total 166 100 - 199 mg/dL   Triglycerides 161 (H) 0 - 149 mg/dL   HDL 41 >09 mg/dL   VLDL Cholesterol Cal 41 (H) 5 - 40 mg/dL   LDL Chol Calc (NIH) 84 0 - 99 mg/dL   Chol/HDL Ratio 4.0 0.0 - 5.0 ratio  HIV antibody (with reflex)  Result Value Ref Range   HIV Screen 4th Generation wRfx Non Reactive Non Reactive  Hepatitis C Antibody  Result Value Ref Range   Hep C Virus Ab Reactive (A) Non Reactive    Assessment & Plan     Problem List Items Addressed This Visit       Cardiovascular and Mediastinum   ASCVD (arteriosclerotic cardiovascular disease)   Chronic; remains on crestor 40 mg Continue to recommend lifestyle modification and diet/exercise LDL goal 50-70   Pt reports recent HA from use of statin; reviewed carotid imaging as well as ASCVD rate at 30% in 1 years Advised to continue to hold statin and control HTN given acute elevations  Re-educated on importance of tobacco cessation 3-5 minute discuss regarding risks of tobacco/nicotine use and recommendations on ways to reduce use and work towards cessation of use of tobacco/nicotine products. Encouraged to use 1-800-QUIT-NOW. As well as moderation of EtOH use      Relevant Medications   cloNIDine (CATAPRES) 0.1 MG tablet   amLODipine (NORVASC) 5 MG tablet   Hypertensive urgency - Primary   Acute, denies injury, stress, use of OTC medications Reports EtOH use 2x/week Continues to smoke Will  add norvasc to assist with PRN clonidine Continue ARB      Relevant Medications   cloNIDine (CATAPRES) 0.1 MG tablet   amLODipine (NORVASC) 5 MG tablet   Other Relevant Orders   CBC with Differential/Platelet (Completed)   Comprehensive Metabolic Panel (CMET) (Completed)   TSH (Completed)   Hemoglobin A1c (Completed)   Lipid panel (Completed)   HIV antibody (with reflex) (Completed)   Hepatitis C Antibody (Completed)     Other   Acute pain of right shoulder   Previously noted in 8/24; pt reports resolution of symptoms with use of celebrex, flexeril and stretching Continue to monitor      Alcohol use disorder   Chronic, stable Heavy use 2x/week Denies concern for impaired functioning Continue to monitor      Encounter for hepatitis C screening test for low risk patient   Low risk screen Treatable, and curable. If left untreated Hep C can lead to cirrhosis and liver failure. Encourage routine testing; recommend repeat testing if risk factors change.       Encounter for screening for HIV   Low risk screen Consented; encouraged  to "know your status" Recommend repeat screen if risk factors change       Tobacco dependence   Chronic, unchanged Continue to monitor Pt is thinking of reduction to assist health; 3-5 minute discuss regarding risks of tobacco/nicotine use and recommendations on ways to reduce use and work towards cessation of use of tobacco/nicotine products. Encouraged to use 1-800-QUIT-NOW.       Return in about 3 weeks (around 10/06/2023) for HTN management.     Leilani Merl, FNP, have reviewed all documentation for this visit. The documentation on 09/17/23 for the exam, diagnosis, procedures, and orders are all accurate and complete.  Jacky Kindle, FNP  [facility] [phone] (phone) [phone] (fax)  [facility]

## 2023-09-16 LAB — LIPID PANEL
Chol/HDL Ratio: 4 {ratio} (ref 0.0–5.0)
Cholesterol, Total: 166 mg/dL (ref 100–199)
HDL: 41 mg/dL (ref >39)
LDL Chol Calc (NIH): 84 mg/dL (ref 0–99)
Triglycerides: 250 mg/dL — ABNORMAL HIGH (ref 0–149)
VLDL Cholesterol Cal: 41 mg/dL — ABNORMAL HIGH (ref 5–40)

## 2023-09-16 LAB — CBC WITH DIFFERENTIAL/PLATELET
Basophils Absolute: 0 10*3/uL (ref 0.0–0.2)
Basos: 1 %
EOS (ABSOLUTE): 0.1 10*3/uL (ref 0.0–0.4)
Eos: 2 %
Hematocrit: 44.3 % (ref 37.5–51.0)
Hemoglobin: 15.3 g/dL (ref 13.0–17.7)
Immature Grans (Abs): 0 10*3/uL (ref 0.0–0.1)
Immature Granulocytes: 0 %
Lymphocytes Absolute: 2.7 10*3/uL (ref 0.7–3.1)
Lymphs: 36 %
MCH: 32.2 pg (ref 26.6–33.0)
MCHC: 34.5 g/dL (ref 31.5–35.7)
MCV: 93 fL (ref 79–97)
Monocytes Absolute: 0.7 10*3/uL (ref 0.1–0.9)
Monocytes: 10 %
Neutrophils Absolute: 3.9 10*3/uL (ref 1.4–7.0)
Neutrophils: 51 %
Platelets: 175 10*3/uL (ref 150–450)
RBC: 4.75 x10E6/uL (ref 4.14–5.80)
RDW: 11.7 % (ref 11.6–15.4)
WBC: 7.5 10*3/uL (ref 3.4–10.8)

## 2023-09-16 LAB — COMPREHENSIVE METABOLIC PANEL
ALT: 47 [IU]/L — ABNORMAL HIGH (ref 0–44)
AST: 47 [IU]/L — ABNORMAL HIGH (ref 0–40)
Albumin: 4.2 g/dL (ref 3.9–4.9)
Alkaline Phosphatase: 113 [IU]/L (ref 44–121)
BUN/Creatinine Ratio: 18 (ref 10–24)
BUN: 15 mg/dL (ref 8–27)
Bilirubin Total: 0.6 mg/dL (ref 0.0–1.2)
CO2: 26 mmol/L (ref 20–29)
Calcium: 8.4 mg/dL — ABNORMAL LOW (ref 8.6–10.2)
Chloride: 102 mmol/L (ref 96–106)
Creatinine, Ser: 0.85 mg/dL (ref 0.76–1.27)
Globulin, Total: 2.4 g/dL (ref 1.5–4.5)
Glucose: 82 mg/dL (ref 70–99)
Potassium: 4.7 mmol/L (ref 3.5–5.2)
Sodium: 145 mmol/L — ABNORMAL HIGH (ref 134–144)
Total Protein: 6.6 g/dL (ref 6.0–8.5)
eGFR: 98 mL/min/{1.73_m2} (ref >59)

## 2023-09-16 LAB — HEMOGLOBIN A1C
Est. average glucose Bld gHb Est-mCnc: 120 mg/dL
Hgb A1c MFr Bld: 5.8 % — ABNORMAL HIGH (ref 4.8–5.6)

## 2023-09-16 LAB — HEPATITIS C ANTIBODY: Hep C Virus Ab: REACTIVE — AB

## 2023-09-16 LAB — TSH: TSH: 3.08 u[IU]/mL (ref 0.450–4.500)

## 2023-09-16 LAB — HIV ANTIBODY (ROUTINE TESTING W REFLEX): HIV Screen 4th Generation wRfx: NONREACTIVE

## 2023-09-17 ENCOUNTER — Other Ambulatory Visit: Payer: Self-pay | Admitting: Family Medicine

## 2023-09-17 DIAGNOSIS — I160 Hypertensive urgency: Secondary | ICD-10-CM | POA: Insufficient documentation

## 2023-09-17 DIAGNOSIS — Z1159 Encounter for screening for other viral diseases: Secondary | ICD-10-CM | POA: Insufficient documentation

## 2023-09-17 DIAGNOSIS — B1920 Unspecified viral hepatitis C without hepatic coma: Secondary | ICD-10-CM

## 2023-09-17 DIAGNOSIS — Z114 Encounter for screening for human immunodeficiency virus [HIV]: Secondary | ICD-10-CM | POA: Insufficient documentation

## 2023-09-17 NOTE — Assessment & Plan Note (Signed)
Previously noted in 8/24; pt reports resolution of symptoms with use of celebrex, flexeril and stretching Continue to monitor

## 2023-09-17 NOTE — Assessment & Plan Note (Signed)
Chronic, unchanged Continue to monitor Pt is thinking of reduction to assist health; 3-5 minute discuss regarding risks of tobacco/nicotine use and recommendations on ways to reduce use and work towards cessation of use of tobacco/nicotine products. Encouraged to use 1-800-QUIT-NOW.

## 2023-09-17 NOTE — Assessment & Plan Note (Signed)
Low risk screen ?Consented; encouraged to "know your status" ?Recommend repeat screen if risk factors change ? ?

## 2023-09-17 NOTE — Assessment & Plan Note (Signed)
Low risk screen Treatable, and curable. If left untreated Hep C can lead to cirrhosis and liver failure. Encourage routine testing; recommend repeat testing if risk factors change.  

## 2023-09-17 NOTE — Assessment & Plan Note (Signed)
Acute, denies injury, stress, use of OTC medications Reports EtOH use 2x/week Continues to smoke Will add norvasc to assist with PRN clonidine Continue ARB

## 2023-09-17 NOTE — Assessment & Plan Note (Signed)
Chronic; remains on crestor 40 mg Continue to recommend lifestyle modification and diet/exercise LDL goal 50-70   Pt reports recent HA from use of statin; reviewed carotid imaging as well as ASCVD rate at 30% in 1 years Advised to continue to hold statin and control HTN given acute elevations  Re-educated on importance of tobacco cessation 3-5 minute discuss regarding risks of tobacco/nicotine use and recommendations on ways to reduce use and work towards cessation of use of tobacco/nicotine products. Encouraged to use 1-800-QUIT-NOW. As well as moderation of EtOH use

## 2023-09-17 NOTE — Assessment & Plan Note (Signed)
Chronic, stable Heavy use 2x/week Denies concern for impaired functioning Continue to monitor

## 2023-09-21 ENCOUNTER — Telehealth: Payer: Self-pay

## 2023-09-21 NOTE — Telephone Encounter (Signed)
Pt given lab results per notes of Robynn Pane on 09/17/23. Pt verbalized understanding. Patient stated he did test positive for Hep C a long time ago when he was around 62 years old and has not had a problem since then. Patient just wanted to make sure the provider was aware.   Jacky Kindle, FNP 09/17/2023  6:42 AM EST     ** Call [facility] ** Recommend additional Hep C labs.   Hep C was reactive; Dependent on additional lab result- may need consult with GI.   Liver enzymes remain elevated; continue to recommend low fat diet, reduction in total alcohol intake,   Prediabetes remains stable. Continue to recommend balanced, lower carb meals. Smaller meal size, adding snacks. Choosing water as drink of choice and increasing purposeful exercise.   Cholesterol panel shows elevated fats. The 10-year ASCVD risk score (Arnett DK, et al., 2019) is: 27.2%   I continue to recommend diet low in saturated fat and regular exercise - 30 min at least 5 times per week.

## 2023-10-02 NOTE — Telephone Encounter (Signed)
Pt advised. Verbalized understanding.

## 2023-10-06 ENCOUNTER — Encounter: Payer: Self-pay | Admitting: Family Medicine

## 2023-10-06 ENCOUNTER — Ambulatory Visit: Payer: Managed Care, Other (non HMO) | Admitting: Family Medicine

## 2023-10-06 VITALS — BP 128/74 | HR 76

## 2023-10-06 DIAGNOSIS — B1920 Unspecified viral hepatitis C without hepatic coma: Secondary | ICD-10-CM | POA: Insufficient documentation

## 2023-10-06 DIAGNOSIS — I2510 Atherosclerotic heart disease of native coronary artery without angina pectoris: Secondary | ICD-10-CM

## 2023-10-06 DIAGNOSIS — F1090 Alcohol use, unspecified, uncomplicated: Secondary | ICD-10-CM

## 2023-10-06 DIAGNOSIS — I10 Essential (primary) hypertension: Secondary | ICD-10-CM

## 2023-10-06 DIAGNOSIS — F17200 Nicotine dependence, unspecified, uncomplicated: Secondary | ICD-10-CM

## 2023-10-06 DIAGNOSIS — I160 Hypertensive urgency: Secondary | ICD-10-CM

## 2023-10-06 MED ORDER — ROSUVASTATIN CALCIUM 40 MG PO TABS: 20.0000 mg | ORAL_TABLET | Freq: Every day | ORAL | Status: DC

## 2023-10-06 MED ORDER — AMLODIPINE BESYLATE 10 MG PO TABS: 10.0000 mg | ORAL_TABLET | Freq: Every day | ORAL | 3 refills | Status: DC

## 2023-10-06 NOTE — Assessment & Plan Note (Signed)
 Chronic, improved X1-2 days binge drinking

## 2023-10-06 NOTE — Assessment & Plan Note (Signed)
 Chronic, improved Has PRN clonidine as needed for flares

## 2023-10-06 NOTE — Assessment & Plan Note (Signed)
 Chronic, stable now Continue norvasc 10 mg with losartan 100 mg

## 2023-10-06 NOTE — Assessment & Plan Note (Signed)
 Chronic, improved Continue to recommend use of crestor Trial of 1/2 dose 20 mg and change in timing to assist with previous symptoms

## 2023-10-06 NOTE — Assessment & Plan Note (Signed)
 Chronic, recommend genotype and RNA

## 2023-10-06 NOTE — Progress Notes (Signed)
 Established patient visit  Patient: Larry Moses   DOB: [birth year]   63 y.o. Male  MRN: [id number] Visit Date: 10/06/2023  Today's healthcare provider: Kelly ONEIDA Cedar, FNP  Introduced to nurse practitioner role and practice setting.  All questions answered.  Discussed provider/patient relationship and expectations.  Chief Complaint  Patient presents with   Follow-up    F/u HTN   Subjective    HPI HPI     Follow-up    Additional comments: F/u HTN      Last edited by Deitra Therisa HERO, CMA on 10/06/2023  3:07 PM.      Reports he had Hep C at age 80; denies use of antivirals. Recommend genotype and RNA.  BP improved; HA symptoms improved.  Has since stopped crestor ; advised to restart 1/2 dose and trial at different dosing time.  Medications: Outpatient Medications Prior to Visit  Medication Sig   cloNIDine  (CATAPRES ) 0.1 MG tablet Take 1 tablet (0.1 mg total) by mouth 3 (three) times daily as needed.   losartan  (COZAAR ) 100 MG tablet Take 1 tablet (100 mg total) by mouth daily.   [DISCONTINUED] amLODipine  (NORVASC ) 5 MG tablet Take 2 tablets (10 mg total) by mouth daily.   [DISCONTINUED] celecoxib  (CELEBREX ) 200 MG capsule TAKE 1 CAPSULE(200 MG) BY MOUTH TWICE DAILY (Patient not taking: Reported on 09/15/2023)   [DISCONTINUED] cyclobenzaprine  (FLEXERIL ) 10 MG tablet Take 1 tablet (10 mg total) by mouth at bedtime.   [DISCONTINUED] predniSONE  (DELTASONE ) 50 MG tablet Take 1 tablet (50 mg total) by mouth daily with breakfast. (Patient not taking: Reported on 09/15/2023)   [DISCONTINUED] rosuvastatin  (CRESTOR ) 40 MG tablet Take 1 tablet (40 mg total) by mouth daily. (Patient not taking: Reported on 10/06/2023)   No facility-administered medications prior to visit.   Last CBC Lab Results  Component Value Date   WBC 7.5 09/15/2023   HGB 15.3 09/15/2023   HCT 44.3 09/15/2023   MCV 93 09/15/2023   MCH 32.2 09/15/2023   RDW 11.7 09/15/2023   PLT 175 09/15/2023   Last  metabolic panel Lab Results  Component Value Date   GLUCOSE 82 09/15/2023   NA 145 (H) 09/15/2023   K 4.7 09/15/2023   CL 102 09/15/2023   CO2 26 09/15/2023   BUN 15 09/15/2023   CREATININE 0.85 09/15/2023   EGFR 98 09/15/2023   CALCIUM  8.4 (L) 09/15/2023   PROT 6.6 09/15/2023   ALBUMIN 4.2 09/15/2023   LABGLOB 2.4 09/15/2023   BILITOT 0.6 09/15/2023   ALKPHOS 113 09/15/2023   AST 47 (H) 09/15/2023   ALT 47 (H) 09/15/2023   Last lipids Lab Results  Component Value Date   CHOL 166 09/15/2023   HDL 41 09/15/2023   LDLCALC 84 09/15/2023   TRIG 250 (H) 09/15/2023   CHOLHDL 4.0 09/15/2023   Last hemoglobin A1c Lab Results  Component Value Date   HGBA1C 5.8 (H) 09/15/2023   Last thyroid functions Lab Results  Component Value Date   TSH 3.080 09/15/2023      Objective    BP 128/74 (BP Location: Right Arm, Patient Position: Sitting, Cuff Size: Normal)   Pulse 76   SpO2 96%   BP Readings from Last 3 Encounters:  10/06/23 128/74  09/15/23 (!) 166/105  06/02/23 127/86   Wt Readings from Last 3 Encounters:  09/15/23 204 lb 9.6 oz (92.8 kg)  06/02/23 205 lb 14.4 oz (93.4 kg)  05/05/23 201 lb 9.6 oz (91.4 kg)  SpO2 Readings from Last 3 Encounters:  10/06/23 96%  09/15/23 96%  06/02/23 97%   Physical Exam Vitals and nursing note reviewed.  Constitutional:      Appearance: Normal appearance. He is obese.  HENT:     Head: Normocephalic and atraumatic.  Cardiovascular:     Rate and Rhythm: Normal rate and regular rhythm.     Pulses: Normal pulses.     Heart sounds: Normal heart sounds.  Pulmonary:     Effort: Pulmonary effort is normal.     Breath sounds: Normal breath sounds.  Musculoskeletal:        General: Normal range of motion.     Cervical back: Normal range of motion.  Skin:    General: Skin is warm and dry.     Capillary Refill: Capillary refill takes less than 2 seconds.  Neurological:     General: No focal deficit present.     Mental Status:  He is alert and oriented to person, place, and time. Mental status is at baseline.  Psychiatric:        Mood and Affect: Mood normal.        Behavior: Behavior normal.        Thought Content: Thought content normal.        Judgment: Judgment normal.     No results found for any visits on 10/06/23.  Assessment & Plan     Problem List Items Addressed This Visit       Cardiovascular and Mediastinum   ASCVD (arteriosclerotic cardiovascular disease)   Chronic, improved Continue to recommend use of crestor  Trial of 1/2 dose 20 mg and change in timing to assist with previous symptoms      Relevant Medications   amLODipine  (NORVASC ) 10 MG tablet   rosuvastatin  (CRESTOR ) 40 MG tablet   Hypertensive urgency - Primary   Chronic, improved Has PRN clonidine  as needed for flares       Relevant Medications   amLODipine  (NORVASC ) 10 MG tablet   rosuvastatin  (CRESTOR ) 40 MG tablet   Primary hypertension   Chronic, stable now Continue norvasc  10 mg with losartan  100 mg       Relevant Medications   amLODipine  (NORVASC ) 10 MG tablet   rosuvastatin  (CRESTOR ) 40 MG tablet     Digestive   Reactivation of hepatitis C viral hepatitis   Chronic, recommend genotype and RNA       Relevant Orders   Hepatitis C genotype   HCV RNA quant rflx ultra or genotyp([facility])     Other   Alcohol use disorder   Chronic, improved X1-2 days binge drinking        Tobacco dependence   Chronic, stable Declines assistance      Return in about 4 weeks (around 11/03/2023) for chonic disease management.     LILLETTE Kelly ONEIDA Emilio, FNP, have reviewed all documentation for this visit. The documentation on 10/06/23 for the exam, diagnosis, procedures, and orders are all accurate and complete.  Kelly ONEIDA Emilio, FNP  [facility] [phone] (phone) [phone] (fax)  [facility]

## 2023-10-06 NOTE — Assessment & Plan Note (Signed)
 Chronic, stable Declines assistance

## 2023-10-09 LAB — HEPATITIS C GENOTYPE

## 2023-10-09 LAB — HCV RNA QUANT RFLX ULTRA OR GENOTYP
HCV RNA (IU/mL): 25100000 [IU]/mL
HCV log10: 7.4 {Log_IU}/mL

## 2023-10-13 ENCOUNTER — Telehealth: Payer: Self-pay | Admitting: Family Medicine

## 2023-10-13 ENCOUNTER — Other Ambulatory Visit: Payer: Self-pay | Admitting: Family Medicine

## 2023-10-13 MED ORDER — CLONIDINE HCL 0.1 MG PO TABS: 0.1000 mg | ORAL_TABLET | Freq: Three times a day (TID) | ORAL | 0 refills | Status: DC | PRN

## 2023-10-13 NOTE — Telephone Encounter (Signed)
 Amlodipine sent on 10/06/23 to [facility]  Clonidine sent in.

## 2023-10-13 NOTE — Telephone Encounter (Signed)
 [facility] pharmacy is requesting prescription refill cloNIDine (CATAPRES) 0.1 MG tablet  & amLODipine (NORVASC) 10 MG tablet  Please advise

## 2023-11-03 ENCOUNTER — Ambulatory Visit: Payer: Managed Care, Other (non HMO) | Admitting: Family Medicine

## 2023-11-07 ENCOUNTER — Encounter: Payer: Self-pay | Admitting: Acute Care

## 2023-11-12 ENCOUNTER — Other Ambulatory Visit: Payer: Self-pay | Admitting: Family Medicine

## 2023-11-13 NOTE — Telephone Encounter (Signed)
 Requested Prescriptions  Pending Prescriptions Disp Refills   cloNIDine  (CATAPRES ) 0.1 MG tablet [Pharmacy Med Name: CLONIDINE  0.1MG  TABLETS] 270 tablet 0    Sig: TAKE 1 TABLET(0.1 MG) BY MOUTH THREE TIMES DAILY AS NEEDED     Cardiovascular:  Alpha-2 Agonists Passed - 11/13/2023  3:48 PM      Passed - Last BP in normal range    BP Readings from Last 1 Encounters:  10/06/23 128/74         Passed - Last Heart Rate in normal range    Pulse Readings from Last 1 Encounters:  10/06/23 76         Passed - Valid encounter within last 6 months    Recent Outpatient Visits           1 month ago Hypertensive urgency   [facility] Normie Becton, FNP   1 month ago Hypertensive urgency   [facility] Iona Manis T, FNP   5 months ago Acute pain of right shoulder   [facility] Normie Becton, FNP   6 months ago Dizziness   [facility] Normie Becton, [place]

## 2023-11-30 ENCOUNTER — Ambulatory Visit: Payer: Managed Care, Other (non HMO) | Admitting: Gastroenterology

## 2023-11-30 ENCOUNTER — Encounter: Payer: Self-pay | Admitting: Gastroenterology

## 2023-11-30 VITALS — BP 124/84 | HR 87 | Temp 98.1°F | Ht 66.0 in | Wt 203.0 lb

## 2023-11-30 DIAGNOSIS — F1090 Alcohol use, unspecified, uncomplicated: Secondary | ICD-10-CM

## 2023-11-30 DIAGNOSIS — B182 Chronic viral hepatitis C: Secondary | ICD-10-CM

## 2023-11-30 DIAGNOSIS — B1920 Unspecified viral hepatitis C without hepatic coma: Secondary | ICD-10-CM

## 2023-11-30 NOTE — Progress Notes (Unsigned)
 Wyline Mood MD, MRCP(U.K) [address]  [address]  [place], [place] [address]  Main: [phone]  Fax: [phone]   Gastroenterology Consultation  Referring Provider:     Sherlyn Hay, DO Primary Care Physician:  Sherlyn Hay, DO Primary Gastroenterologist:  Dr. Wyline Mood  Reason for Consultation:     Hepatitis C         HPI:   NAHIEM DREDGE is a 63 y.o. y/o male referred for consultation & management  by Dr. Sherlyn Hay, DO.     10/06/2023 : Hep C viral load 25 million, genotype Ia, HIV negative. 09/15/2023 CMP AST of 47 ALT of 47 total bilirubin of 0.6 albumin of 4.2, normal platelet count. No imaging of the abdomen available.  He recollects that back in the 80s he was admitted to the hospital with severe jaundice at that time was using intravenous drugs in addition was drinking large quantities of alcohol he recollects he was admitted for a few days given an antibiotic and told that his hepatitis was cured.  Denies any family history of liver disease continues to drink about 8 beers in addition to hard liquor a few times a week.  Does not use any intravenous drugs at this point of time.  Has had professional tattoos.  No incarceration no military service no blood transfusions.  He says he is not yet due for a surveillance colonoscopy last colonoscopy was found to have noncancerous polyps.  I reviewed his chart and he was recommended to have a repeat colonoscopy in 1 year letter was sent out No past medical history on file.  Past Surgical History:  Procedure Laterality Date   COLONOSCOPY WITH PROPOFOL N/A 09/11/2020   Procedure: COLONOSCOPY WITH PROPOFOL;  Surgeon: Pasty Spillers, MD;  Location: [facility] ENDOSCOPY;  Service: Endoscopy;  Laterality: N/A;    Prior to Admission medications   Medication Sig Start Date End Date Taking? Authorizing Provider  amLODipine (NORVASC) 10 MG tablet Take 1 tablet (10 mg total) by mouth daily. 10/06/23   Jacky Kindle,  FNP  cloNIDine (CATAPRES) 0.1 MG tablet TAKE 1 TABLET(0.1 MG) BY MOUTH THREE TIMES DAILY AS NEEDED 11/13/23   Pardue, Monico Blitz, DO  losartan (COZAAR) 100 MG tablet Take 1 tablet (100 mg total) by mouth daily. 08/25/23   Jacky Kindle, FNP  rosuvastatin (CRESTOR) 40 MG tablet Take 0.5 tablets (20 mg total) by mouth daily. Trial 1/2 tab daily for stroke and heart attack reduction; follow up on headaches 10/06/23   Jacky Kindle, FNP    Family History  Problem Relation Age of Onset   Ovarian cancer Mother    Breast cancer Mother    Lung cancer Maternal Grandfather      Social History   Tobacco Use   Smoking status: Every Day    Current packs/day: 1.00    Average packs/day: 1 pack/day for 44.0 years (44.0 ttl pk-yrs)    Types: Cigarettes   Smokeless tobacco: Current    Types: Snuff  Vaping Use   Vaping status: Never Used  Substance Use Topics   Alcohol use: No   Drug use: No    Allergies as of 11/30/2023   (No Known Allergies)    Review of Systems:    All systems reviewed and negative except where noted in HPI.   Physical Exam:  There were no vitals taken for this visit. No LMP for male patient. Psych:  Alert and cooperative. Normal  mood and affect. General:   Alert,  Well-developed, well-nourished, pleasant and cooperative in NAD Head:  Normocephalic and atraumatic. Eyes:  Sclera clear, no icterus.   Conjunctiva pink.  Neurologic:  Alert and oriented x3;  grossly normal neurologically. Psych:  Alert and cooperative. Normal mood and affect.  Imaging Studies: No results found.  Assessment and Plan:   DEAUNTE DENTE is a 63 y.o. y/o male has been referred for hepatitis C as well as colon cancer screening.  Treatment nave viral load over 20 million, genotype Ia.  No biochemical evidence of portal hypertension of chronic liver disease but will obtain imaging, fibrosis sure test, rule out hepatitis B, determine immune status of hepatitis A following which we will determine  treatment duration.  At his follow-up visit I would also suggest him to undergo a colonoscopy for polyp surveillance as he is due.  Advised him to stop drinking alcohol as he is excessively consuming at this point of time.    Follow up in 3 months  Dr Wyline Mood MD,MRCP(U.K)

## 2023-12-01 ENCOUNTER — Ambulatory Visit: Payer: Managed Care, Other (non HMO) | Admitting: Family Medicine

## 2023-12-02 LAB — HCV FIBROSURE
ALPHA 2-MACROGLOBULINS, QN: 381 mg/dL — ABNORMAL HIGH (ref 110–276)
ALT (SGPT) P5P: 45 IU/L (ref 0–55)
Apolipoprotein A-1: 170 mg/dL (ref 101–178)
Bilirubin, Total: 0.6 mg/dL (ref 0.0–1.2)
Fibrosis Score: 0.69 — ABNORMAL HIGH (ref 0.00–0.21)
GGT: 201 IU/L — ABNORMAL HIGH (ref 0–65)
Haptoglobin: 207 mg/dL (ref 32–363)
Necroinflammat Activity Score: 0.38 — ABNORMAL HIGH (ref 0.00–0.17)

## 2023-12-02 LAB — HEPATITIS B E ANTIGEN: Hep B E Ag: NEGATIVE

## 2023-12-02 LAB — HEPATITIS B E ANTIBODY: Hep B E Ab: REACTIVE — AB

## 2023-12-02 LAB — HEPATITIS A ANTIBODY, TOTAL: hep A Total Ab: NEGATIVE

## 2023-12-02 LAB — HEPATITIS B SURFACE ANTIBODY,QUALITATIVE: Hep B Surface Ab, Qual: NONREACTIVE

## 2023-12-02 LAB — HEPATITIS B CORE ANTIBODY, TOTAL: Hep B Core Total Ab: POSITIVE — AB

## 2023-12-06 ENCOUNTER — Telehealth: Payer: Self-pay

## 2023-12-06 NOTE — Telephone Encounter (Signed)
 Called patient but was not able to leave him a detailed message as his voicemail is not set-up yet. I will be sending him a message via MyChart since he is active and has read messages prior.

## 2023-12-06 NOTE — Telephone Encounter (Signed)
-----   Message from Wyline Mood sent at 12/05/2023  1:13 PM EST ----- Inform  That he has a prior infection with hepatitis B which has resolved and if we do decide to proceed with hepatitis C treatment he will require very close follow-up as sometimes the hepatitis B which is dormant can get reactivated.  I am leaving the prac tice on May 31 and I am not sure who is taking over hence I would not wish to start treatment unless I can be confident that someone can manage him in my absence.  He can wait till his upcoming appointment in May to decide if he would like to be trea ted by [facility] gastroenterology provided we have someone to take over or he has an option to be seen at [facility] clinic by myself or any other physician to commence treatment so that close follow-up can be provided

## 2023-12-07 ENCOUNTER — Ambulatory Visit
Admission: RE | Admit: 2023-12-07 | Discharge: 2023-12-07 | Disposition: A | Discharge status: Home / Self Care | Payer: Managed Care, Other (non HMO) | Source: Ambulatory Visit | Attending: Gastroenterology | Admitting: Gastroenterology

## 2023-12-07 DIAGNOSIS — B182 Chronic viral hepatitis C: Secondary | ICD-10-CM | POA: Insufficient documentation

## 2024-02-15 ENCOUNTER — Ambulatory Visit: Payer: Managed Care, Other (non HMO) | Admitting: Gastroenterology

## 2024-02-15 NOTE — Progress Notes (Deleted)
    Luke Salaam MD, MRCP(U.K) [address]  [address]  [place], [place] [address]  Main: [phone]  Fax: [phone]   Primary Care Physician: Carlean Charter, DO  Primary Gastroenterologist:  Dr. Luke Salaam   No chief complaint on file.   HPI: Larry Moses is a 63 y.o. male   Summary of history :  Initially referred and seen in 11/2023 for chronic hepatitis C. 10/06/2023 : Hep C viral load 25 million, genotype Ia, HIV negative. 09/15/2023 CMP AST of 47 ALT of 47 total bilirubin of 0.6 albumin of 4.2, normal platelet count. No imaging of the abdomen available.   He recollects that back in the 80s he was admitted to the hospital with severe jaundice at that time was using intravenous drugs in addition was drinking large quantities of alcohol he recollects he was admitted for a few days given an antibiotic and told that his hepatitis was cured.  Denies any family history of liver disease continues to drink about 8 beers in addition to hard liquor a few times a week.  Does not use any intravenous drugs at this point of time.  Has had professional tattoos.  No incarceration no military service no blood transfusions.  Interval history  11/30/2023-02/15/2024  12/20/2023:   ***   Current Outpatient Medications  Medication Sig Dispense Refill   amLODipine  (NORVASC ) 10 MG tablet Take 1 tablet (10 mg total) by mouth daily. 90 tablet 3   cloNIDine  (CATAPRES ) 0.1 MG tablet TAKE 1 TABLET(0.1 MG) BY MOUTH THREE TIMES DAILY AS NEEDED 270 tablet 0   losartan  (COZAAR ) 100 MG tablet Take 1 tablet (100 mg total) by mouth daily. 90 tablet 3   No current facility-administered medications for this visit.    Allergies as of 02/15/2024   (No Known Allergies)       Interval history   ***/***/202*   ***/***/2024   ROS:  General: Negative for anorexia, weight loss, fever, chills, fatigue, weakness. ENT: Negative for hoarseness, difficulty swallowing , nasal congestion. CV: Negative  for chest pain, angina, palpitations, dyspnea on exertion, peripheral edema.  Respiratory: Negative for dyspnea at rest, dyspnea on exertion, cough, sputum, wheezing.  GI: See history of present illness. GU:  Negative for dysuria, hematuria, urinary incontinence, urinary frequency, nocturnal urination.  Endo: Negative for unusual weight change.    Physical Examination:   There were no vitals taken for this visit.  General: Well-nourished, well-developed in no acute distress.  Eyes: No icterus. Conjunctivae pink. Mouth: Oropharyngeal mucosa moist and pink , no lesions erythema or exudate. Lungs: Clear to auscultation bilaterally. Non-labored. Heart: Regular rate and rhythm, no murmurs rubs or gallops.  Abdomen: Bowel sounds are normal, nontender, nondistended, no hepatosplenomegaly or masses, no abdominal bruits or hernia , no rebound or guarding.   Extremities: No lower extremity edema. No clubbing or deformities. Neuro: Alert and oriented x 3.  Grossly intact. Skin: Warm and dry, no jaundice.   Psych: Alert and cooperative, normal mood and affect.   Imaging Studies: No results found.  Assessment and Plan:   Larry Moses is a 63 y.o. y/o male here to follow up for for hepatitis C, Treatment nave viral load over 20 million, genotype Ia. No biochemical evidence of portal hypertension   Advised him to stop drinking alcohol as he is excessively consuming at this point of time.     Dr Luke Salaam  MD,MRCP [facility]) Follow up in ***

## 2024-05-27 ENCOUNTER — Telehealth: Payer: Self-pay | Admitting: Family Medicine

## 2024-05-27 ENCOUNTER — Other Ambulatory Visit: Payer: Self-pay

## 2024-05-27 NOTE — Telephone Encounter (Signed)
 [facility] faxed refill request for the following medications:  losartan (COZAAR) 100 MG tablet   Please advise.

## 2024-05-28 MED ORDER — LOSARTAN POTASSIUM 100 MG PO TABS: 100.0000 mg | ORAL_TABLET | Freq: Every day | ORAL | 3 refills | Status: AC

## 2024-10-14 ENCOUNTER — Telehealth: Payer: Self-pay | Admitting: Family Medicine

## 2024-10-14 DIAGNOSIS — I10 Essential (primary) hypertension: Secondary | ICD-10-CM

## 2024-10-14 DIAGNOSIS — I160 Hypertensive urgency: Secondary | ICD-10-CM

## 2024-10-14 MED ORDER — AMLODIPINE BESYLATE 10 MG PO TABS: 10.0000 mg | ORAL_TABLET | Freq: Every day | ORAL | 0 refills | Status: AC

## 2024-10-14 NOTE — Telephone Encounter (Signed)
 Refill Request   Pharmacy: [facility]  Medication: amLODipine  (NORVASC ) 10 MG tablet   Quantity (if provided): 90  Please send in refill request.

## 2024-10-14 NOTE — Telephone Encounter (Signed)
 Converted and courtesy refill sent in

## 2024-10-20 ENCOUNTER — Other Ambulatory Visit: Payer: Self-pay | Admitting: Family Medicine

## 2024-10-20 DIAGNOSIS — I160 Hypertensive urgency: Secondary | ICD-10-CM

## 2024-10-20 DIAGNOSIS — I10 Essential (primary) hypertension: Secondary | ICD-10-CM
